# Patient Record
Sex: Male | Born: 1965 | Race: White | Hispanic: No | Marital: Single | Smoking: Never smoker
Health system: Southern US, Community
[De-identification: ages and names within clinical notes are randomized; demographics above are authoritative.]

## PROBLEM LIST (undated history)

## (undated) DIAGNOSIS — E669 Obesity, unspecified: Secondary | ICD-10-CM

## (undated) DIAGNOSIS — J9819 Other pulmonary collapse: Secondary | ICD-10-CM

## (undated) DIAGNOSIS — J40 Bronchitis, not specified as acute or chronic: Secondary | ICD-10-CM

## (undated) DIAGNOSIS — R600 Localized edema: Secondary | ICD-10-CM

## (undated) DIAGNOSIS — R269 Unspecified abnormalities of gait and mobility: Secondary | ICD-10-CM

## (undated) DIAGNOSIS — J45909 Unspecified asthma, uncomplicated: Secondary | ICD-10-CM

## (undated) DIAGNOSIS — R609 Edema, unspecified: Secondary | ICD-10-CM

## (undated) DIAGNOSIS — E559 Vitamin D deficiency, unspecified: Secondary | ICD-10-CM

## (undated) DIAGNOSIS — G43909 Migraine, unspecified, not intractable, without status migrainosus: Secondary | ICD-10-CM

## (undated) DIAGNOSIS — M4712 Other spondylosis with myelopathy, cervical region: Secondary | ICD-10-CM

## (undated) DIAGNOSIS — G809 Cerebral palsy, unspecified: Secondary | ICD-10-CM

## (undated) DIAGNOSIS — J189 Pneumonia, unspecified organism: Secondary | ICD-10-CM

## (undated) HISTORY — DX: Unspecified asthma, uncomplicated: J45.909

## (undated) HISTORY — DX: Obesity, unspecified: E66.9

## (undated) HISTORY — DX: Other spondylosis with myelopathy, cervical region: M47.12

## (undated) HISTORY — PX: INTERTROCHANTERIC HIP FRACTURE SURGERY: SHX681

## (undated) HISTORY — DX: Cerebral palsy, unspecified: G80.9

## (undated) HISTORY — DX: Edema, unspecified: R60.9

## (undated) HISTORY — PX: TENDON TRANSFER: SHX6109

## (undated) HISTORY — DX: Localized edema: R60.0

## (undated) HISTORY — PX: CERVICAL FUSION: SHX112

## (undated) HISTORY — PX: LAMINECTOMY LUMBAR SPLINE W/ PLACEMENT SPINAL CORD STIMULATOR: SHX1916

## (undated) HISTORY — PX: CHOLECYSTECTOMY: SHX55

## (undated) HISTORY — DX: Vitamin D deficiency, unspecified: E55.9

## (undated) HISTORY — DX: Unspecified abnormalities of gait and mobility: R26.9

## (undated) HISTORY — DX: Migraine, unspecified, not intractable, without status migrainosus: G43.909

---

## 2002-02-16 HISTORY — PX: LUNG SURGERY: SHX703

## 2002-04-11 ENCOUNTER — Encounter: Admission: RE | Admit: 2002-04-11 | Discharge: 2002-04-11 | Payer: Self-pay | Admitting: Family Medicine

## 2002-05-02 ENCOUNTER — Encounter: Admission: RE | Admit: 2002-05-02 | Discharge: 2002-07-07 | Payer: Self-pay | Admitting: Family Medicine

## 2002-11-07 ENCOUNTER — Encounter: Admission: RE | Admit: 2002-11-07 | Discharge: 2002-11-07 | Payer: Self-pay | Admitting: Family Medicine

## 2003-02-17 DIAGNOSIS — J9819 Other pulmonary collapse: Secondary | ICD-10-CM

## 2003-02-17 HISTORY — DX: Other pulmonary collapse: J98.19

## 2003-03-05 ENCOUNTER — Encounter: Admission: RE | Admit: 2003-03-05 | Discharge: 2003-03-05 | Payer: Self-pay | Admitting: Family Medicine

## 2003-03-12 ENCOUNTER — Encounter: Admission: RE | Admit: 2003-03-12 | Discharge: 2003-03-12 | Payer: Self-pay | Admitting: Family Medicine

## 2003-03-20 ENCOUNTER — Encounter: Admission: RE | Admit: 2003-03-20 | Discharge: 2003-03-20 | Payer: Self-pay | Admitting: Family Medicine

## 2003-04-16 ENCOUNTER — Encounter: Admission: RE | Admit: 2003-04-16 | Discharge: 2003-04-16 | Payer: Self-pay | Admitting: Family Medicine

## 2003-05-03 ENCOUNTER — Inpatient Hospital Stay (HOSPITAL_COMMUNITY): Admission: EM | Admit: 2003-05-03 | Discharge: 2003-05-15 | Payer: Self-pay | Admitting: Emergency Medicine

## 2003-05-03 ENCOUNTER — Encounter (INDEPENDENT_AMBULATORY_CARE_PROVIDER_SITE_OTHER): Payer: Self-pay | Admitting: *Deleted

## 2003-05-03 ENCOUNTER — Encounter: Admission: RE | Admit: 2003-05-03 | Discharge: 2003-05-03 | Payer: Self-pay | Admitting: Sports Medicine

## 2003-05-08 ENCOUNTER — Encounter (INDEPENDENT_AMBULATORY_CARE_PROVIDER_SITE_OTHER): Payer: Self-pay | Admitting: *Deleted

## 2003-05-23 ENCOUNTER — Encounter: Admission: RE | Admit: 2003-05-23 | Discharge: 2003-05-23 | Payer: Self-pay | Admitting: Thoracic Surgery

## 2003-05-29 ENCOUNTER — Encounter: Admission: RE | Admit: 2003-05-29 | Discharge: 2003-05-29 | Payer: Self-pay | Admitting: Sports Medicine

## 2003-06-08 ENCOUNTER — Encounter: Admission: RE | Admit: 2003-06-08 | Discharge: 2003-06-08 | Payer: Self-pay | Admitting: Family Medicine

## 2003-06-20 ENCOUNTER — Encounter: Admission: RE | Admit: 2003-06-20 | Discharge: 2003-06-20 | Payer: Self-pay | Admitting: Thoracic Surgery

## 2003-08-10 ENCOUNTER — Inpatient Hospital Stay (HOSPITAL_COMMUNITY): Admission: EM | Admit: 2003-08-10 | Discharge: 2003-08-15 | Payer: Self-pay | Admitting: Emergency Medicine

## 2003-08-13 ENCOUNTER — Encounter (INDEPENDENT_AMBULATORY_CARE_PROVIDER_SITE_OTHER): Payer: Self-pay | Admitting: *Deleted

## 2003-08-22 ENCOUNTER — Encounter: Admission: RE | Admit: 2003-08-22 | Discharge: 2003-08-22 | Payer: Self-pay | Admitting: Thoracic Surgery

## 2003-10-18 ENCOUNTER — Inpatient Hospital Stay (HOSPITAL_COMMUNITY): Admission: EM | Admit: 2003-10-18 | Discharge: 2003-10-24 | Payer: Self-pay | Admitting: Emergency Medicine

## 2003-12-20 ENCOUNTER — Encounter: Admission: RE | Admit: 2003-12-20 | Discharge: 2003-12-20 | Payer: Self-pay | Admitting: Thoracic Surgery

## 2003-12-26 ENCOUNTER — Ambulatory Visit: Payer: Self-pay | Admitting: Family Medicine

## 2003-12-28 ENCOUNTER — Ambulatory Visit: Payer: Self-pay | Admitting: Family Medicine

## 2004-04-09 ENCOUNTER — Ambulatory Visit: Payer: Self-pay | Admitting: Family Medicine

## 2004-11-20 ENCOUNTER — Ambulatory Visit: Payer: Self-pay | Admitting: Family Medicine

## 2004-11-21 ENCOUNTER — Encounter: Admission: RE | Admit: 2004-11-21 | Discharge: 2004-11-21 | Payer: Self-pay | Admitting: Sports Medicine

## 2004-12-08 ENCOUNTER — Encounter: Admission: RE | Admit: 2004-12-08 | Discharge: 2005-03-08 | Payer: Self-pay | Admitting: Sports Medicine

## 2005-04-18 IMAGING — CR DG CHEST 1V PORT
1 series · 1 of 1 positions shown · non-contrast
Comparison: none

CLINICAL DATA: Shortness of breath, left chest tube placement.
 PORTABLE CHEST, 05/03/03, [DATE] HOURS
 Comparison 04/16/03.
 Interval left lower chest tube with its tip at the medial aspect of the left lower lung zone.  Moderate sized left pleural effusion with improvement.  Left basilar air-space opacity.   Mildly increased prominence of the pulmonary vasculature and interstitial markings on the right.  Enlarged cardiac silhouette.  Unremarkable bones.  Patient?s chin overlying the medial left lung apex with no pneumothorax seen.  
 IMPRESSION 
 Status post left chest tube placement with an interval decrease in amount of left pleural fluid.
 Left basilar atelectasis or pneumonia.
 Cardiomegaly and mild congestive heart failure.

[view not recorded]
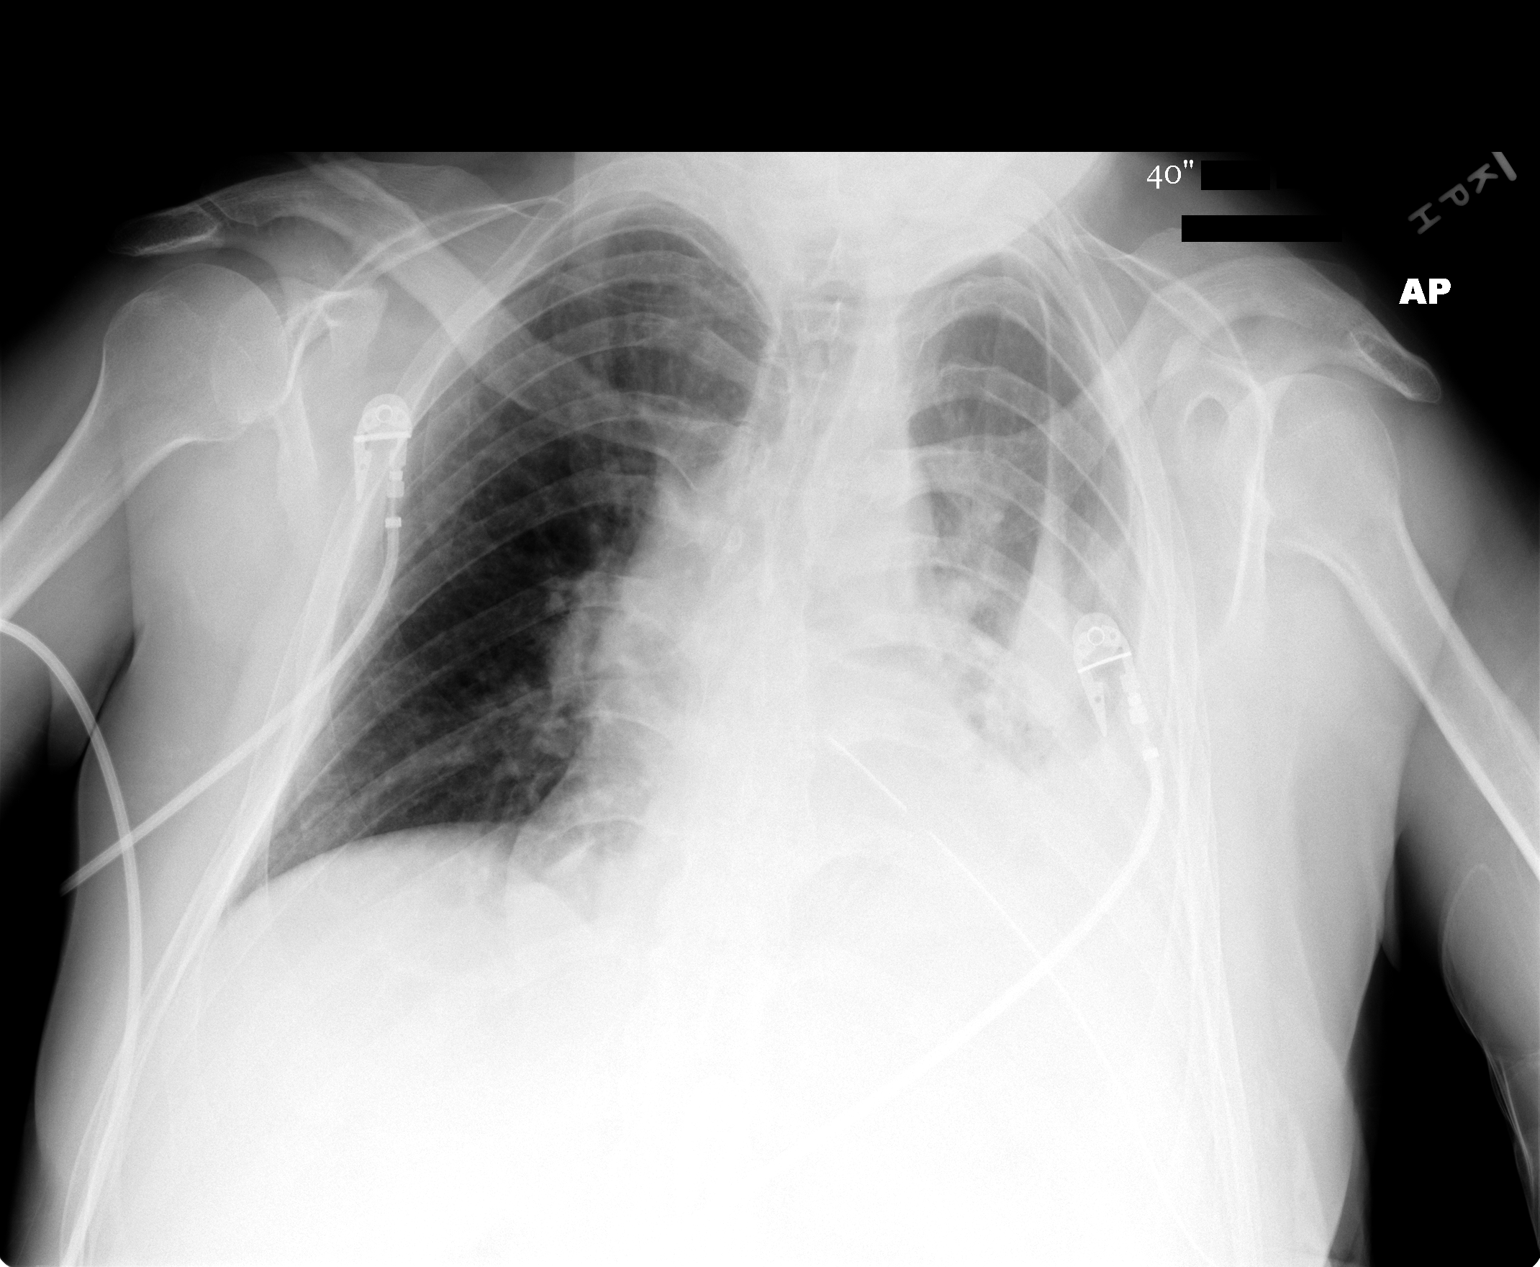

[1 of 1 positions shown; findings below may reference images not displayed]

## 2005-04-20 IMAGING — CR DG CHEST 1V PORT
2 series · 2 of 2 positions shown · non-contrast
Comparison: none

CLINICAL DATA: 37-year-old male with pleural effusion.  Chest tube insertion. 
 SINGLE PORTABLE CHEST 05/05/03 AT 9893 HOURS.  
 Comparison 05/04/03.

[view not recorded (1 of 2)]
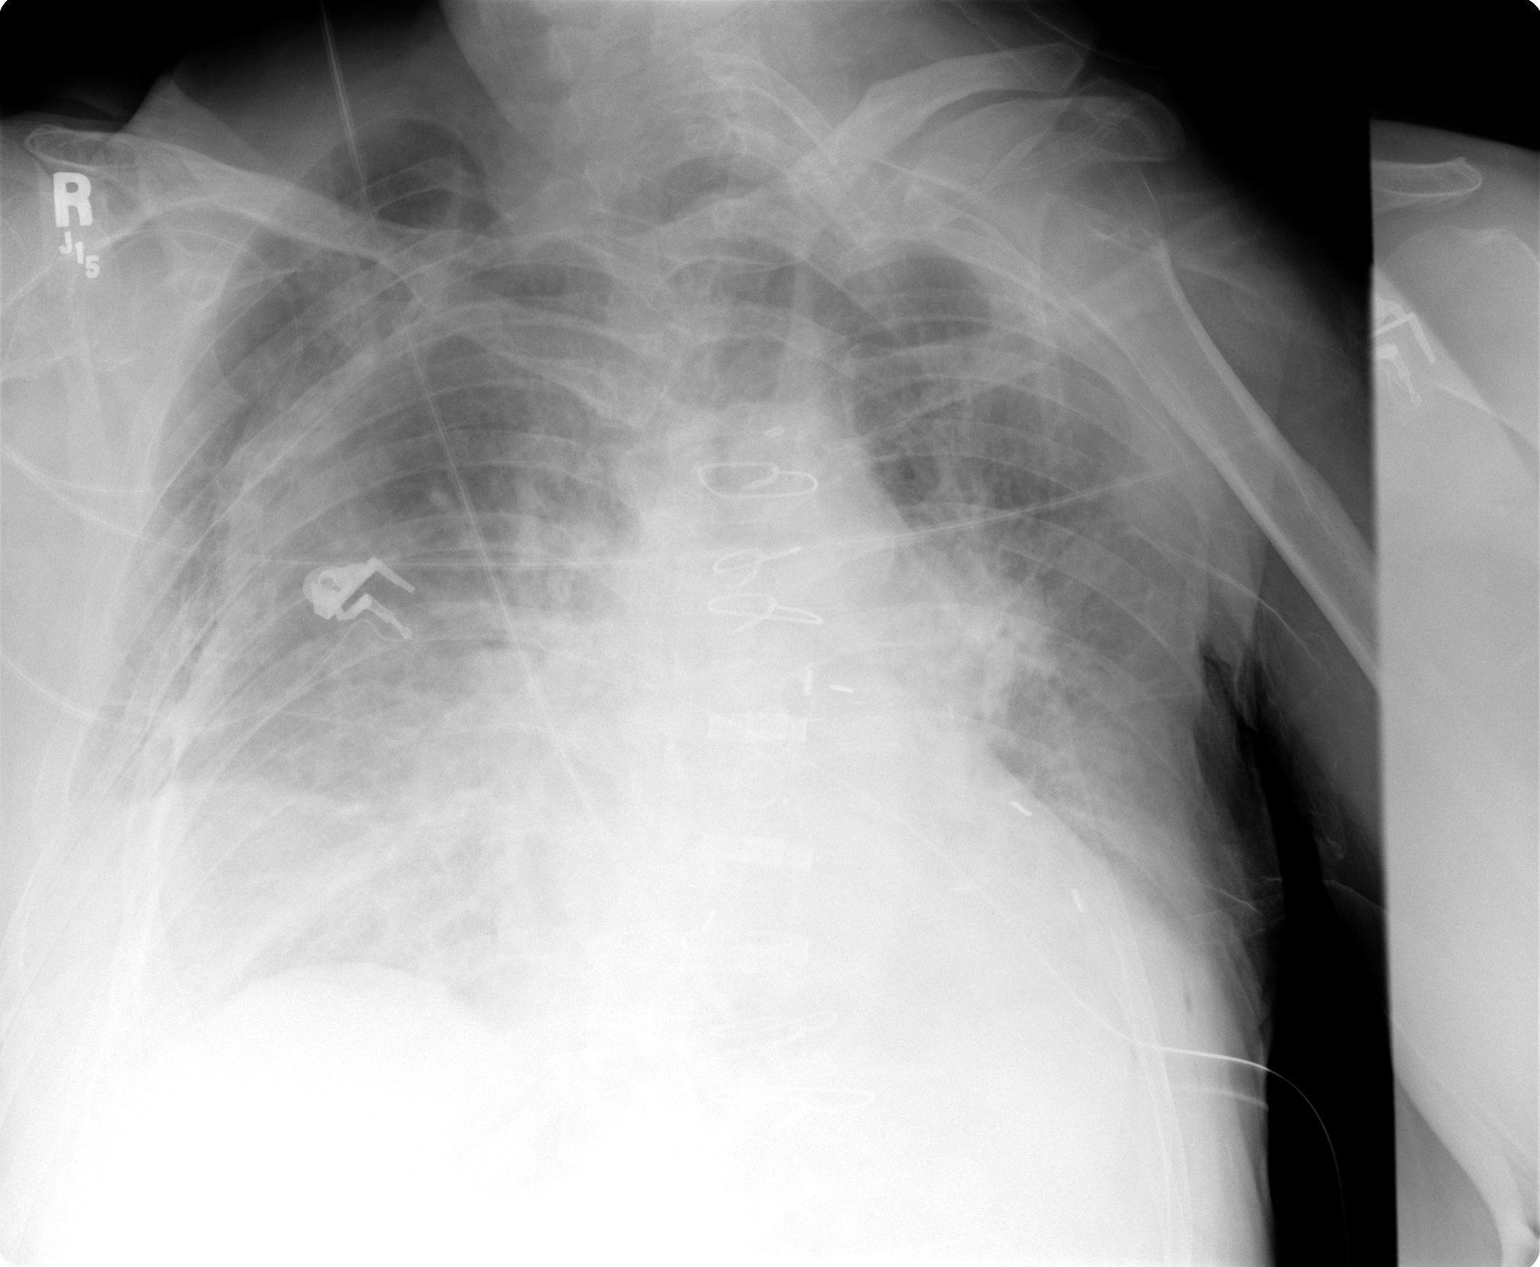

[view not recorded (2 of 2)]
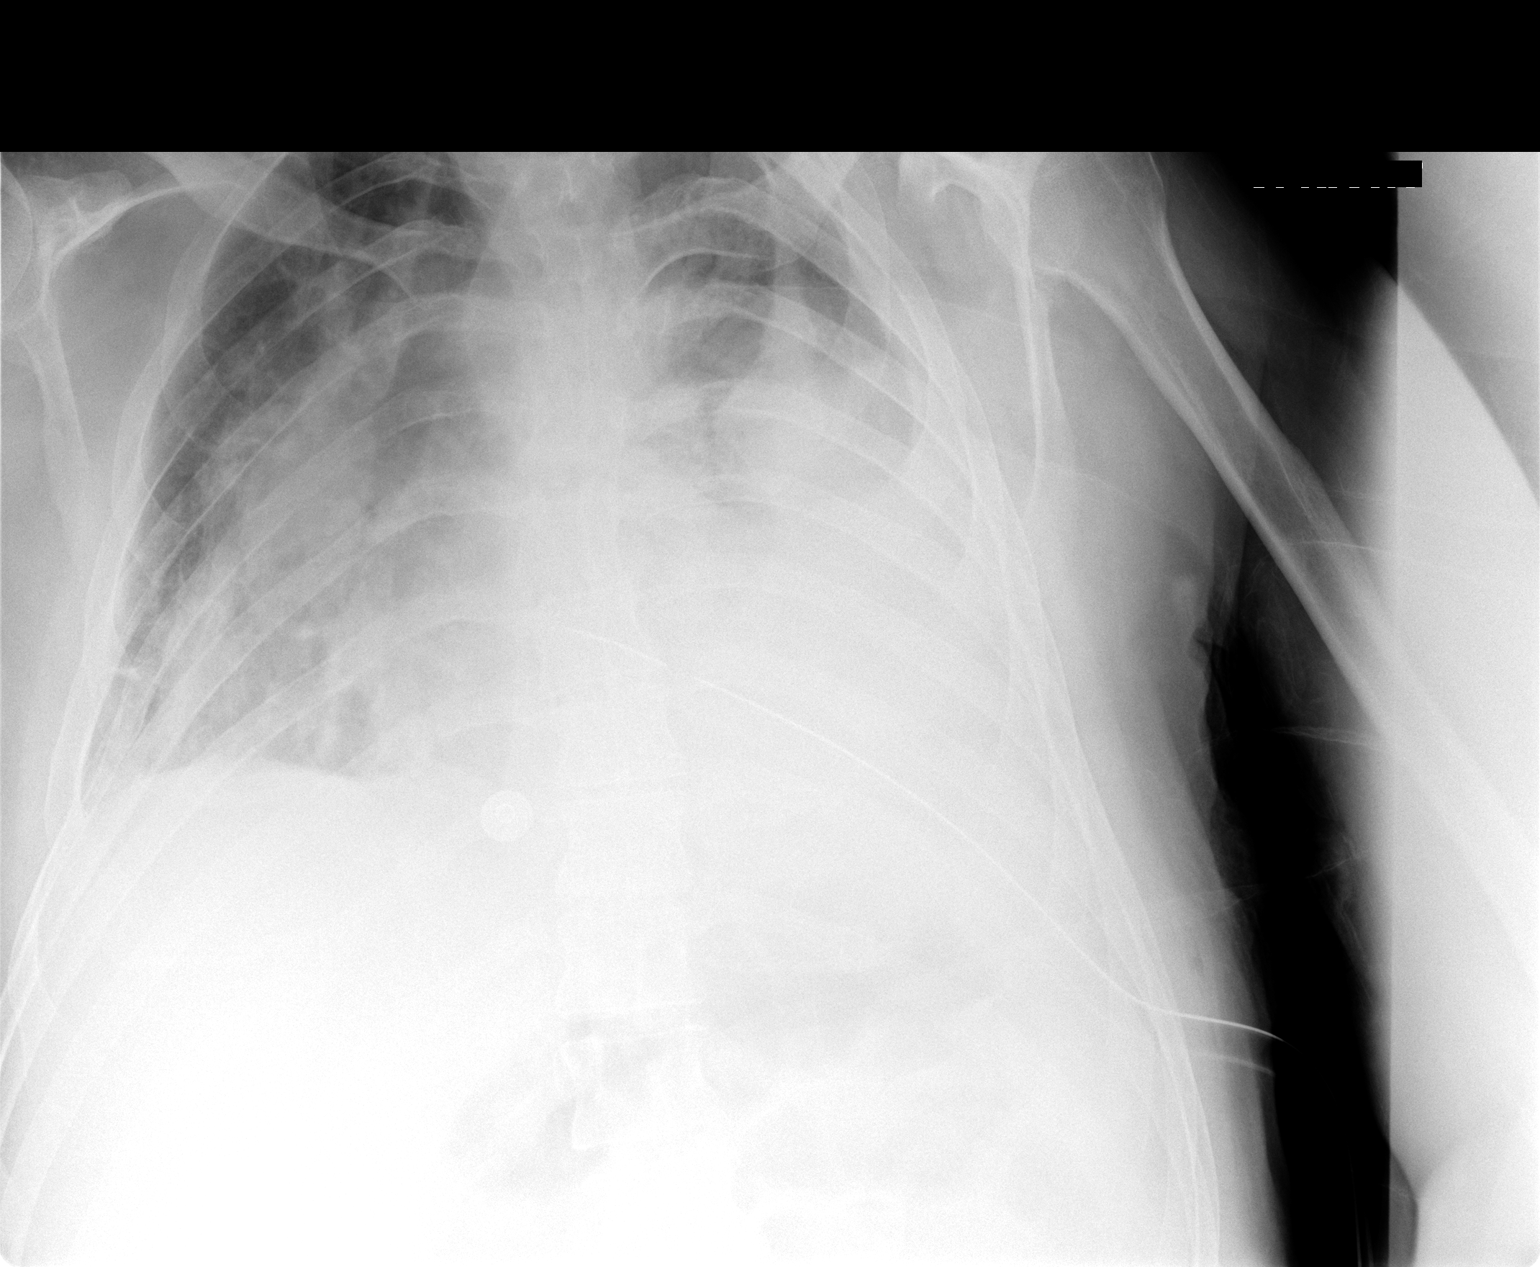

[2 of 2 positions shown; findings below may reference images not displayed]

FINDINGS: Left basilar chest tube remains extending to just across the mid line.  There is slight continued increase in the left pleural effusion with adjacent lingula and left lower lobe airspace disease/atelectasis.  The exam is rotated to the right.  Right perihilar and lower lobe airspace disease persists.  No pneumothorax.  
 IMPRESSION
 Increasing left pleural effusion.  
 Stable left lower lobe/lingula atelectasis/pneumonia.  
 Slight increasing right perihilar and lower lobe airspace disease/edema.  
 Of note, the initial chest radiograph on this patient was a double exposure and the film was repeated because of this.

## 2005-05-04 ENCOUNTER — Ambulatory Visit: Payer: Self-pay | Admitting: Family Medicine

## 2005-09-28 ENCOUNTER — Ambulatory Visit: Payer: Self-pay | Admitting: Family Medicine

## 2005-12-21 ENCOUNTER — Ambulatory Visit: Payer: Self-pay | Admitting: Family Medicine

## 2006-02-10 ENCOUNTER — Ambulatory Visit: Payer: Self-pay | Admitting: Family Medicine

## 2006-03-11 ENCOUNTER — Encounter (INDEPENDENT_AMBULATORY_CARE_PROVIDER_SITE_OTHER): Payer: Self-pay | Admitting: Family Medicine

## 2006-03-11 ENCOUNTER — Ambulatory Visit (HOSPITAL_COMMUNITY): Admission: RE | Admit: 2006-03-11 | Discharge: 2006-03-11 | Payer: Self-pay | Admitting: Family Medicine

## 2006-03-11 ENCOUNTER — Ambulatory Visit: Payer: Self-pay | Admitting: Family Medicine

## 2006-03-11 LAB — CONVERTED CEMR LAB
AST: 14 units/L (ref 0–37)
Alkaline Phosphatase: 111 units/L (ref 39–117)
CO2: 24 meq/L (ref 19–32)
Chloride: 102 meq/L (ref 96–112)
Glucose, Bld: 76 mg/dL (ref 70–99)
HCT: 45.7 % (ref 39.0–52.0)
Hemoglobin: 16.2 g/dL (ref 13.0–17.0)
MCHC: 35.4 g/dL (ref 30.0–36.0)
MCV: 84.8 fL (ref 78.0–100.0)
RBC: 5.39 M/uL (ref 4.22–5.81)
RDW: 13 % (ref 11.5–14.0)
Total Bilirubin: 1.7 mg/dL — ABNORMAL HIGH (ref 0.3–1.2)
Total Protein: 7.2 g/dL (ref 6.0–8.3)
WBC: 10.8 10*3/uL — ABNORMAL HIGH (ref 4.0–10.5)

## 2006-03-19 ENCOUNTER — Ambulatory Visit: Payer: Self-pay | Admitting: Family Medicine

## 2006-03-19 ENCOUNTER — Encounter (INDEPENDENT_AMBULATORY_CARE_PROVIDER_SITE_OTHER): Payer: Self-pay | Admitting: Family Medicine

## 2006-03-19 LAB — CONVERTED CEMR LAB
BUN: 16 mg/dL (ref 6–23)
Chloride: 101 meq/L (ref 96–112)
HDL: 50 mg/dL (ref >39)
LDL Cholesterol: 112 mg/dL — ABNORMAL HIGH (ref 0–99)
Total CHOL/HDL Ratio: 3.7
Triglycerides: 117 mg/dL (ref <150)
VLDL: 23 mg/dL (ref 0–40)

## 2006-04-15 DIAGNOSIS — G809 Cerebral palsy, unspecified: Secondary | ICD-10-CM | POA: Insufficient documentation

## 2006-04-15 DIAGNOSIS — I10 Essential (primary) hypertension: Secondary | ICD-10-CM

## 2006-04-27 ENCOUNTER — Encounter: Admission: RE | Admit: 2006-04-27 | Discharge: 2006-07-26 | Payer: Self-pay | Admitting: Family Medicine

## 2006-09-06 ENCOUNTER — Telehealth: Payer: Self-pay | Admitting: *Deleted

## 2006-09-08 ENCOUNTER — Ambulatory Visit: Payer: Self-pay | Admitting: Family Medicine

## 2006-09-08 LAB — CONVERTED CEMR LAB
Blood in Urine, dipstick: NEGATIVE
Glucose, Urine, Semiquant: NEGATIVE
Nitrite: NEGATIVE
Specific Gravity, Urine: 1.015
Urobilinogen, UA: 0.2

## 2006-11-30 ENCOUNTER — Telehealth: Payer: Self-pay | Admitting: *Deleted

## 2006-12-03 ENCOUNTER — Ambulatory Visit: Payer: Self-pay | Admitting: Internal Medicine

## 2006-12-03 DIAGNOSIS — L299 Pruritus, unspecified: Secondary | ICD-10-CM | POA: Insufficient documentation

## 2007-02-01 ENCOUNTER — Ambulatory Visit: Payer: Self-pay | Admitting: Sports Medicine

## 2007-02-01 LAB — CONVERTED CEMR LAB: Blood Glucose, Fingerstick: 97

## 2007-02-07 ENCOUNTER — Telehealth: Payer: Self-pay | Admitting: *Deleted

## 2007-02-08 ENCOUNTER — Encounter (INDEPENDENT_AMBULATORY_CARE_PROVIDER_SITE_OTHER): Payer: Self-pay | Admitting: Family Medicine

## 2007-02-08 ENCOUNTER — Ambulatory Visit: Payer: Self-pay | Admitting: Vascular Surgery

## 2007-02-08 ENCOUNTER — Ambulatory Visit (HOSPITAL_COMMUNITY): Admission: RE | Admit: 2007-02-08 | Discharge: 2007-02-08 | Payer: Self-pay | Admitting: Cardiology

## 2007-02-08 ENCOUNTER — Ambulatory Visit: Payer: Self-pay | Admitting: Sports Medicine

## 2007-02-08 ENCOUNTER — Encounter (INDEPENDENT_AMBULATORY_CARE_PROVIDER_SITE_OTHER): Payer: Self-pay | Admitting: Cardiology

## 2007-02-08 DIAGNOSIS — R635 Abnormal weight gain: Secondary | ICD-10-CM

## 2007-02-08 LAB — CONVERTED CEMR LAB
Chloride: 100 meq/L (ref 96–112)
Creatinine, Ser: 0.76 mg/dL (ref 0.40–1.50)
Potassium: 3.5 meq/L (ref 3.5–5.3)

## 2007-02-13 ENCOUNTER — Encounter (INDEPENDENT_AMBULATORY_CARE_PROVIDER_SITE_OTHER): Payer: Self-pay | Admitting: Family Medicine

## 2007-05-30 ENCOUNTER — Telehealth (INDEPENDENT_AMBULATORY_CARE_PROVIDER_SITE_OTHER): Payer: Self-pay | Admitting: Family Medicine

## 2007-06-07 ENCOUNTER — Telehealth (INDEPENDENT_AMBULATORY_CARE_PROVIDER_SITE_OTHER): Payer: Self-pay | Admitting: Family Medicine

## 2007-06-09 ENCOUNTER — Encounter (INDEPENDENT_AMBULATORY_CARE_PROVIDER_SITE_OTHER): Payer: Self-pay | Admitting: Family Medicine

## 2007-11-18 ENCOUNTER — Ambulatory Visit: Payer: Self-pay | Admitting: Family Medicine

## 2008-01-16 ENCOUNTER — Encounter: Payer: Self-pay | Admitting: *Deleted

## 2008-01-27 ENCOUNTER — Encounter (INDEPENDENT_AMBULATORY_CARE_PROVIDER_SITE_OTHER): Payer: Self-pay | Admitting: Family Medicine

## 2008-02-01 ENCOUNTER — Ambulatory Visit: Payer: Self-pay | Admitting: Family Medicine

## 2008-02-01 ENCOUNTER — Encounter (INDEPENDENT_AMBULATORY_CARE_PROVIDER_SITE_OTHER): Payer: Self-pay | Admitting: Family Medicine

## 2008-02-03 ENCOUNTER — Telehealth (INDEPENDENT_AMBULATORY_CARE_PROVIDER_SITE_OTHER): Payer: Self-pay | Admitting: Family Medicine

## 2008-02-03 DIAGNOSIS — E559 Vitamin D deficiency, unspecified: Secondary | ICD-10-CM | POA: Insufficient documentation

## 2008-02-03 LAB — CONVERTED CEMR LAB
AST: 27 units/L (ref 0–37)
Albumin: 5.1 g/dL (ref 3.5–5.2)
CO2: 20 meq/L (ref 19–32)
Calcium: 9.8 mg/dL (ref 8.4–10.5)
Creatinine, Ser: 0.78 mg/dL (ref 0.40–1.50)
Direct LDL: 141 mg/dL — ABNORMAL HIGH
Glucose, Bld: 85 mg/dL (ref 70–99)
Sodium: 141 meq/L (ref 135–145)

## 2008-03-14 ENCOUNTER — Ambulatory Visit: Payer: Self-pay | Admitting: Family Medicine

## 2008-05-01 ENCOUNTER — Telehealth: Payer: Self-pay | Admitting: *Deleted

## 2008-05-08 ENCOUNTER — Encounter (INDEPENDENT_AMBULATORY_CARE_PROVIDER_SITE_OTHER): Payer: Self-pay | Admitting: Family Medicine

## 2008-05-08 ENCOUNTER — Ambulatory Visit: Payer: Self-pay | Admitting: Family Medicine

## 2008-05-08 LAB — CONVERTED CEMR LAB
Cholesterol: 175 mg/dL (ref 0–200)
HDL: 47 mg/dL (ref >39)
VLDL: 20 mg/dL (ref 0–40)
Vit D, 25-Hydroxy: 23 ng/mL — ABNORMAL LOW (ref 30–89)

## 2008-05-09 ENCOUNTER — Telehealth (INDEPENDENT_AMBULATORY_CARE_PROVIDER_SITE_OTHER): Payer: Self-pay | Admitting: Family Medicine

## 2008-05-14 ENCOUNTER — Telehealth (INDEPENDENT_AMBULATORY_CARE_PROVIDER_SITE_OTHER): Payer: Self-pay | Admitting: Family Medicine

## 2008-05-21 ENCOUNTER — Telehealth: Payer: Self-pay | Admitting: *Deleted

## 2008-06-07 ENCOUNTER — Ambulatory Visit: Payer: Self-pay | Admitting: Family Medicine

## 2008-06-07 DIAGNOSIS — L219 Seborrheic dermatitis, unspecified: Secondary | ICD-10-CM | POA: Insufficient documentation

## 2008-08-07 ENCOUNTER — Telehealth: Payer: Self-pay | Admitting: Family Medicine

## 2008-08-08 ENCOUNTER — Ambulatory Visit: Payer: Self-pay | Admitting: Family Medicine

## 2008-08-08 DIAGNOSIS — R51 Headache: Secondary | ICD-10-CM

## 2008-08-08 DIAGNOSIS — R519 Headache, unspecified: Secondary | ICD-10-CM | POA: Insufficient documentation

## 2008-11-19 ENCOUNTER — Ambulatory Visit: Payer: Self-pay | Admitting: Family Medicine

## 2009-04-24 ENCOUNTER — Encounter: Payer: Self-pay | Admitting: Family Medicine

## 2009-04-25 ENCOUNTER — Telehealth: Payer: Self-pay | Admitting: *Deleted

## 2009-04-25 ENCOUNTER — Encounter: Admission: RE | Admit: 2009-04-25 | Discharge: 2009-04-25 | Payer: Self-pay | Admitting: Family Medicine

## 2009-04-25 ENCOUNTER — Ambulatory Visit: Payer: Self-pay | Admitting: Family Medicine

## 2009-04-26 ENCOUNTER — Telehealth: Payer: Self-pay | Admitting: Family Medicine

## 2009-04-27 ENCOUNTER — Encounter: Payer: Self-pay | Admitting: Family Medicine

## 2009-04-27 ENCOUNTER — Ambulatory Visit: Payer: Self-pay | Admitting: Family Medicine

## 2009-04-27 ENCOUNTER — Inpatient Hospital Stay (HOSPITAL_COMMUNITY): Admission: EM | Admit: 2009-04-27 | Discharge: 2009-04-30 | Payer: Self-pay | Admitting: Emergency Medicine

## 2009-05-01 ENCOUNTER — Telehealth: Payer: Self-pay | Admitting: Family Medicine

## 2009-05-09 ENCOUNTER — Ambulatory Visit: Payer: Self-pay | Admitting: Family Medicine

## 2009-06-10 ENCOUNTER — Telehealth: Payer: Self-pay | Admitting: Family Medicine

## 2009-06-18 ENCOUNTER — Encounter: Payer: Self-pay | Admitting: Family Medicine

## 2009-07-25 ENCOUNTER — Telehealth: Payer: Self-pay | Admitting: Family Medicine

## 2009-07-30 ENCOUNTER — Encounter: Payer: Self-pay | Admitting: Family Medicine

## 2009-10-04 ENCOUNTER — Ambulatory Visit: Payer: Self-pay | Admitting: Family Medicine

## 2009-11-28 ENCOUNTER — Ambulatory Visit: Payer: Self-pay | Admitting: Family Medicine

## 2009-11-28 ENCOUNTER — Encounter: Payer: Self-pay | Admitting: Family Medicine

## 2009-11-28 LAB — CONVERTED CEMR LAB
ALT: 24 units/L (ref 0–53)
AST: 27 units/L (ref 0–37)
Albumin: 4.7 g/dL (ref 3.5–5.2)
Calcium: 9.6 mg/dL (ref 8.4–10.5)
Cholesterol: 195 mg/dL (ref 0–200)
Glucose, Bld: 80 mg/dL (ref 70–99)
HDL: 52 mg/dL (ref >39)
Sodium: 139 meq/L (ref 135–145)
Total Bilirubin: 3.2 mg/dL — ABNORMAL HIGH (ref 0.3–1.2)
Total CHOL/HDL Ratio: 3.8
Triglycerides: 81 mg/dL (ref <150)

## 2010-03-04 ENCOUNTER — Encounter: Payer: Self-pay | Admitting: *Deleted

## 2010-03-18 NOTE — Progress Notes (Signed)
Summary: MEDS PROB  Phone Note Call from Patient   Summary of Call: [facility] cannot get this meds(Levaquin) and needs to refer to [facility] Initial call taken by: De Nurse,  April 25, 2009 3:53 PM  Follow-up for Phone Call        lm for him to call me back. Follow-up by: Golden Circle RN,  April 25, 2009 3:54 PM  Additional Follow-up for Phone Call Additional follow up Details #1::        pt informed Additional Follow-up by: Golden Circle RN,  April 25, 2009 3:58 PM    Prescriptions: LEVAQUIN 750-5 MG/150ML-% SOLN (LEVOFLOXACIN IN D5W) take 750 mg once daily for 5 days please take 1 hour before meals or 2 hours after meals to ensure effectivenss  #5 days (qs) x 0   Entered by:   Golden Circle RN   Authorized by:   Doree Albee MD   Signed by:   Golden Circle RN on 04/25/2009   Method used:   Electronically to        [facility]* (retail)       [address]       [place], [place]  [id number]       Ph: [phone]       Fax: [phone]   RxID:   [id number]

## 2010-03-18 NOTE — Assessment & Plan Note (Signed)
Summary: migraines,tcb   Vital Signs:  Patient profile:   45 year old male Temp:     98.9 degrees F Pulse rate:   79 / minute BP sitting:   120 / 98  Vitals Entered By: Jone Baseman CMA (October 04, 2009 9:37 AM) CC: migraines x 1 week Is Patient Diabetic? No Pain Assessment Patient in pain? yes     Location: head Intensity: 6   Primary Care Provider:  Bobby Rumpf  MD  CC:  migraines x 1 week.  History of Present Illness: 1) Migraines: Symptoms x 1 week. Partial relief with Excedrin migraine. Lasts for > 4 hours. Right sided. Pounding, hurts behind eye. Triggered by cigarette smoke. +nausea, emesis (non-bloody, non bilious) x 1Last episode of similar headache 5 months ago with pneumonia. No associated vision change or new focal neurological signs or aura. Headache does not wake patient from sleep. Better with rest. Worse with bright lights, fluorescent lights, loud noises. Mild sensation of sinus congestion, runny nose. Headache has been relieved in past with IM Toradol in clinic.    ROS: Denies new focal neuological signs, vision change, cough, fever, chills, diarrhea, abdominal pain, head trauma,   Habits & Providers  Alcohol-Tobacco-Diet     Tobacco Status: never  Current Medications (verified): 1)  Hydrochlorothiazide 12.5 Mg Tabs (Hydrochlorothiazide) .... Take 1 Tablet By Mouth Once A Day 2)  Vitamin D3 1000 Unit Tabs (Cholecalciferol) .... Take One Tablet Daily X 3 Months and Then Have Vit D Level Rechecked 3)  Calcium 500 Mg Tabs (Calcium Carbonate) .... Take One Tablet Two Times A Day 4)  Therapeutic T+plus Max St 3 % Sham (Salicylic Acid) .... Massage Into Wet Hair, Wait 5 Minutes; Rinse Thoroughly.  Use Daily X1 Week, Then Every Other Day.  Disp #1 Large Bottle. 5)  Triamcinolone Acetonide 0.025 % Lotn (Triamcinolone Acetonide) .... Apply To Scalp Two Times A Day X2 Weeks.  Disp #1 Large Bottle. 6)  Ventolin Hfa 108 (90 Base) Mcg/act Aers (Albuterol Sulfate)  .... One To Two Puffs Q 4 Hours As Needed Short of Breath. Dispense With Spacer and Instructions For Use.  Allergies (verified): No Known Drug Allergies  Physical Exam  General:  sitting in wheelchair, pleasant, NAD, appears unccomfortable with bright lights, much better when lights are turned off  Eyes:  pupils equal, round and reactive to light , extraoccular movements intact , fundi normal  Nose:  mild sinus tenderness over right frontal sinus  Mouth:  moist membranes  Neck:  limited ROM secondary to h/o spinal fusion    Impression & Recommendations:  Problem # 1:  HEADACHE (ICD-784.0) Assessment Deteriorated Migraine likely given symptomatology. Toradol 15 mg given IM - patient tolerated well. Headache diary given. Follow up symptoms. No red flags on history or exam. Follow up for CPE.  Orders: Ketorolac-Toradol 15mg  (Z6109) [facility]- Est Level  3 ([address]454)  Complete Medication List: 1)  Hydrochlorothiazide 12.5 Mg Tabs (Hydrochlorothiazide) .... Take 1 tablet by mouth once a day 2)  Vitamin D3 1000 Unit Tabs (Cholecalciferol) .... Take one tablet daily x 3 months and then have vit d level rechecked 3)  Calcium 500 Mg Tabs (Calcium carbonate) .... Take one tablet two times a day 4)  Therapeutic T+plus Max St 3 % Sham (Salicylic acid) .... Massage into wet hair, wait 5 minutes; rinse thoroughly.  use daily x1 week, then every other day.  disp #1 large bottle. 5)  Triamcinolone Acetonide 0.025 % Lotn (Triamcinolone acetonide) .... Apply to  scalp two times a day x2 weeks.  disp #1 large bottle. 6)  Ventolin Hfa 108 (90 Base) Mcg/act Aers (Albuterol sulfate) .... One to two puffs q 4 hours as needed short of breath. dispense with spacer and instructions for use.  Patient Instructions: 1)  1) Follow up if headache not improving, or if developing worse nausea, vision problems, weakness or other concerning symptoms.  2)  2) Fill out headache diary - an online version is available at  [email or web address] 3)  3) Follow up as needed.  4)  4) Try to avoid triggers of headache.    Medication Administration  Injection # 1:    Medication: Ketorolac-Toradol 15mg     Diagnosis: HEADACHE (ICD-784.0)    Route: IM    Site: LUOQ gluteus    Exp Date: 03/20/2011    Lot #: [id number]    Mfr: [email or web address]    Comments: 30mg  given    Patient tolerated injection without complications    Given by: Jone Baseman CMA (October 04, 2009 10:10 AM)  Orders Added: 1)  Ketorolac-Toradol 15mg  [J1885] 2)  [facility]- Est Level  3 [65784]

## 2010-03-18 NOTE — Miscellaneous (Signed)
Summary: flu?  Clinical Lists Changes sick x 2 days. diarrhea, poor appetite, no fever, is coughing. HA is the worst symptom. has been taking tylenol. has not tried ibuprofen. [facility] [address]. is not able to get him in today. told her md is in clinic & I wuill call with response.Golden Circle RN  April 24, 2009 3:04 PM   Patient can take ibuprofen as needed for headache in the short term (over next 5-7 days) can take imodium as needed for diarrhea. If symptoms worsen, should come in for appointment prior to further medications.  Bobby Rumpf  MD  April 24, 2009 3:15 PM   Appended Document: flu? gave her above advise. she will call back if she wants him seen tomorrow. asked that she cal early in day to get best choice of times.

## 2010-03-18 NOTE — Progress Notes (Signed)
Summary: phn msg  Phone Note Call from Patient Call back at [phone]   Caller: mom-Phyllis Summary of Call: [facility] needs another statement that patient is in a permanent wheelchair. mom has application, so pls call her when this is ready so she can send this in all together Initial call taken by: De Nurse,  July 25, 2009 2:39 PM  Follow-up for Phone Call        Letter typed and placed at front desk. .    Follow-up by: Bobby Rumpf  MD,  July 30, 2009 8:49 AM

## 2010-03-18 NOTE — Assessment & Plan Note (Signed)
Summary: physical and flu shot/bmc   Vital Signs:  Patient profile:   45 year old male Height:      57 inches Weight:      201.9 pounds BMI:     43.85 Temp:     98.2 degrees F oral Pulse rate:   79 / minute BP sitting:   126 / 92  (left arm) Cuff size:   regular  Vitals Entered By: Garen Grams LPN (November 28, 2009 2:38 PM) CC: cpe Is Patient Diabetic? No Pain Assessment Patient in pain? no        Primary Care Provider:  Bobby Rumpf  MD  CC:  cpe.  History of Present Illness: 1) HTN: BP 126/92 today. Taking HCTZ as below without side effects. Denies chest pain, dyspnea, LE edema. Does not monitor salt intake or follow DASH diet.   2) Cerebral palsy: Wheelchair bound. Appropriate resources in place.   3) Prevention: Due for flu vaccine, lipd panel with HTN. Up to date on pneumovax.   Habits & Providers  Alcohol-Tobacco-Diet     Alcohol drinks/day: 0     Tobacco Status: never  Current Medications (verified): 1)  Hydrochlorothiazide 12.5 Mg Tabs (Hydrochlorothiazide) .... Take 1 Tablet By Mouth Once A Day 2)  Vitamin D3 1000 Unit Tabs (Cholecalciferol) .... Take One Tablet Daily X 3 Months and Then Have Vit D Level Rechecked 3)  Calcium 500 Mg Tabs (Calcium Carbonate) .... Take One Tablet Two Times A Day 4)  Therapeutic T+plus Max St 3 % Sham (Salicylic Acid) .... Massage Into Wet Hair, Wait 5 Minutes; Rinse Thoroughly.  Use Daily X1 Week, Then Every Other Day.  Disp #1 Large Bottle. 5)  Triamcinolone Acetonide 0.025 % Lotn (Triamcinolone Acetonide) .... Apply To Scalp Two Times A Day X2 Weeks.  Disp #1 Large Bottle. 6)  Ventolin Hfa 108 (90 Base) Mcg/act Aers (Albuterol Sulfate) .... One To Two Puffs Q 4 Hours As Needed Short of Breath. Dispense With Spacer and Instructions For Use.  Allergies (verified): No Known Drug Allergies  Physical Exam  General:  sitting in wheelchair, pleasant, NAD,  Head:  normocephalic and atraumatic.   Eyes:  pupils equal, round and  reactive to light, fundi normal  Ears:  TMs clear bilaterally Nose:  External nasal examination shows no deformity or inflammation. Nasal mucosa are pink and moist without lesions or exudates. Mouth:  moist membranes  Neck:  limited ROM secondary to h/o spinal fusion  Lungs:  CTAB, normal WOB, no retractions, no wheeze or crackles  Heart:  RRR, no murmurs  Abdomen:  S/NT/ND, +BS Msk:  wheelchair bound  Pulses:  2+ radials  Extremities:  well perfused  Neurologic:  alert & oriented X3.  cranial nerves II-XII intact.   Skin:  no skin breakdown  Psych:  normally interactive, good eye contact, not anxious appearing, and not depressed appearing.     Impression & Recommendations:  Problem # 1:  Preventive Health Care (ICD-V70.0)  Flu vaccine today. Check lipid panel. Follow up one year.   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 No medication changes at this time. Follow up 6 months.   His updated medication list for this problem includes:    Hydrochlorothiazide 12.5 Mg Tabs (Hydrochlorothiazide) .Marland Kitchen... Take 1 tablet by mouth once a day  Orders: Lipid-[facility] ([id number]) Comp Met-[facility] ([id number])  BP today: 126/92 Prior BP: 120/98 (10/04/2009)  Labs Reviewed: K+: 3.7 (02/01/2008) Creat: :  0.78 (02/01/2008)   Chol: 175 (05/08/2008)   HDL: 47 (05/08/2008)   LDL: 108 (05/08/2008)   TG: 101 (05/08/2008)  Complete Medication List: 1)  Hydrochlorothiazide 12.5 Mg Tabs (Hydrochlorothiazide) .... Take 1 tablet by mouth once a day 2)  Vitamin D3 1000 Unit Tabs (Cholecalciferol) .... Take one tablet daily x 3 months and then have vit d level rechecked 3)  Calcium 500 Mg Tabs (Calcium carbonate) .... Take one tablet two times a day 4)  Therapeutic T+plus Max St 3 % Sham (Salicylic acid) .... Massage into wet hair, wait 5 minutes; rinse thoroughly.  use daily x1 week, then every other day.  disp #1 large bottle. 5)   Triamcinolone Acetonide 0.025 % Lotn (Triamcinolone acetonide) .... Apply to scalp two times a day x2 weeks.  disp #1 large bottle. 6)  Ventolin Hfa 108 (90 Base) Mcg/act Aers (Albuterol sulfate) .... One to two puffs q 4 hours as needed short of breath. dispense with spacer and instructions for use.  Other Orders: Flu Vaccine 50yrs + [phone]) Admin 1st Vaccine (86578) Admin 1st Vaccine [facility]) [phone])  Patient Instructions: 1)  It was great to see you today!  2)  Follow up in 6 months  - sooner if needed. Prescriptions: VENTOLIN HFA 108 (90 BASE) MCG/ACT AERS (ALBUTEROL SULFATE) one to two puffs q 4 hours as needed short of breath. Dispense with spacer and instructions for use.  #1 x 1   Entered and Authorized by:   Bobby Rumpf  MD   Signed by:   Bobby Rumpf  MD on 11/28/2009   Method used:   Electronically to        [facility]* (retail)       [address]       [place], [place]  [address]       Ph: [phone]       Fax: [phone]   RxID:   [id number]    Influenza Vaccine    Vaccine Type: Fluvax 3+    Site: left deltoid    Mfr: GlaxoSmithKline    Dose: 0.5 ml    Route: IM    Given by: Garen Grams LPN    Exp. Date: 08/10/2010    Lot #: [id number]    VIS given: 09/10/09 version given November 28, 2009.  Flu Vaccine Consent Questions    Do you have a history of severe allergic reactions to this vaccine? no    Any prior history of allergic reactions to egg and/or gelatin? no    Do you have a sensitivity to the preservative Thimersol? no    Do you have a past history of Guillan-Barre Syndrome? no    Do you currently have an acute febrile illness? no    Have you ever had a severe reaction to latex? no    Vaccine information given and explained to patient? yes

## 2010-03-18 NOTE — Progress Notes (Signed)
Summary: phn msg  Phone Note Call from Patient Call back at [phone]   Caller: Mom-Phyllis Summary of Call: [facility] is stating that they have a new rule stating that he needs a shoulder strap that comes from window-across his head & neck and he a plate in head and cannot turn his head.  It chokes him and can't wear it.  [facility] told Mom she needs a note from his doctor stating that he cannot wear this and needs to be excused from this new rule. Initial call taken by: De Nurse,  June 10, 2009 11:57 AM  Follow-up for Phone Call        mom is asking again about note for [facility] Follow-up by: De Nurse,  Jun 18, 2009 11:17 AM  Additional Follow-up for Phone Call Additional follow up Details #1::        to pcp. please write this note asap Additional Follow-up by: Golden Circle RN,  Jun 18, 2009 11:39 AM    Additional Follow-up for Phone Call Additional follow up Details #2::    Notified mother that letter ready to be picked up. Follow-up by: Clydell Hakim,  Jun 18, 2009 2:00 PM

## 2010-03-18 NOTE — Progress Notes (Signed)
Summary: triage  Phone Note Call from Patient Call back at Home Phone [phone]   Caller: mom-Phyliss Summary of Call: Seen yesterday had really bad night. Has double pneumonia and not sure if she should be surpressing the cough or letting him cough.   Initial call taken by: Clydell Hakim,  April 26, 2009 8:35 AM  Follow-up for Phone Call        has not eaten in 5 days. will take liquids. hx pna & collapsed lung. mom is Very concerned. he was up all night coughing & is very weak. told her I will speak with pcp & he or I will call her back Follow-up by: Golden Circle RN,  April 26, 2009 8:36 AM  Additional Follow-up for Phone Call Additional follow up Details #1::        Outpatient treatment for CAP w/ Levaquin. Felt warm, was coughing overnight. Able to get some rest today. Advised to continue to encourage by mouth intake especially fluids. If not improving, advised to call our triage line. Advised Tylenol or fever / pain control  Additional Follow-up by: Bobby Rumpf  MD,  April 26, 2009 12:38 PM

## 2010-03-18 NOTE — Letter (Signed)
Summary: Generic Letter  [facility]  [address]   [place], [place] [address]   Phone: [phone]  Fax: [phone]    07/30/2009  [name] [address] [place], [place]  [address]  To whom it may concern:  This letter is to certify that the above-mentioned patient is permanently confined to a wheelchair as a result of cerebral palsy. Please feel free to direct any questions or correspondence regarding this or any other health-related matters to my office at the address and/or phone number provided.       Sincerely,   Bobby Rumpf  MD

## 2010-03-18 NOTE — Assessment & Plan Note (Signed)
Summary: hospital f/u per mom/tcb   Vital Signs:  Patient profile:   45 year old male Height:      57 inches Weight:      199 pounds BMI:     43.22 Pulse rate:   86 / minute BP sitting:   124 / 88  Vitals Entered By: Golden Circle RN (May 09, 2009 8:36 AM)  Primary Care Provider:  Bobby Rumpf  MD  CC:  Follow up [facility] hosp.Marland Kitchen  History of Present Illness: 1) PNA: Hospitalized from 3/15 to 3/20 for treatment of CAP (had attempted outpatient treatment with Levaquin but patient was not improving though had been on medication less than 48 hrs). Received 4 days of ceftriaxone, 5 days of azithromycin. Reports breathing is much better, denies fevers, chills, emesis, diarrhea, chest pain, wheezing. Reports improvement in coughing; he is using his albuterol inhaler every four hours for his coughing. Takes Tylenol PM to help with sleep. Completed antibiotics course day after discharge.   Habits & Providers  Alcohol-Tobacco-Diet     Alcohol drinks/day: 0     Tobacco Status: never  Exercise-Depression-Behavior     Drug Use: never     Seat Belt Use: always  Current Medications (verified): 1)  Hydrochlorothiazide 12.5 Mg Tabs (Hydrochlorothiazide) .... Take 1 Tablet By Mouth Once A Day 2)  Vitamin D3 1000 Unit Tabs (Cholecalciferol) .... Take One Tablet Daily X 3 Months and Then Have Vit D Level Rechecked 3)  Calcium 500 Mg Tabs (Calcium Carbonate) .... Take One Tablet Two Times A Day 4)  Therapeutic T+plus Max St 3 % Sham (Salicylic Acid) .... Massage Into Wet Hair, Wait 5 Minutes; Rinse Thoroughly.  Use Daily X1 Week, Then Every Other Day.  Disp #1 Large Bottle. 5)  Triamcinolone Acetonide 0.025 % Lotn (Triamcinolone Acetonide) .... Apply To Scalp Two Times A Day X2 Weeks.  Disp #1 Large Bottle. 6)  Ventolin Hfa 108 (90 Base) Mcg/act Aers (Albuterol Sulfate) .... One To Two Puffs Q 4 Hours As Needed Short of Breath. Dispense With Spacer and Instructions For Use.  Allergies (verified): No  Known Drug Allergies  Social History: Risk analyst Use:  always Drug Use:  never  Physical Exam  General:  sitting in wheelchair, pleasant, NAD  Mouth:  moist membranes  Lungs:  CTAB, normal WOB, no retractions, no wheeze or crackles  Heart:  RRR, no murmurs  Abdomen:  S/NT/ND, +BS Pulses:  2+ radials  Extremities:  well perfused  Neurologic:  alert & oriented X3.     Impression & Recommendations:  Problem # 1:  PNEUMONIA, BILATERAL (ICD-486) Assessment Improved  Improving. Advised to use incentive spirometer hourly until improved, then three times a day. Refilled ventolin with spacer; provided instructions for use. Reviewed red flags that would prompt return to care. Consider repeat Xray at 6 weeks for follow up.   Orders: [facility]- Est Level  3 ([address])  Complete Medication List: 1)  Hydrochlorothiazide 12.5 Mg Tabs (Hydrochlorothiazide) .... Take 1 tablet by mouth once a day 2)  Vitamin D3 1000 Unit Tabs (Cholecalciferol) .... Take one tablet daily x 3 months and then have vit d level rechecked 3)  Calcium 500 Mg Tabs (Calcium carbonate) .... Take one tablet two times a day 4)  Therapeutic T+plus Max St 3 % Sham (Salicylic acid) .... Massage into wet hair, wait 5 minutes; rinse thoroughly.  use daily x1 week, then every other day.  disp #1 large bottle. 5)  Triamcinolone Acetonide 0.025 % Lotn (Triamcinolone  acetonide) .... Apply to scalp two times a day x2 weeks.  disp #1 large bottle. 6)  Ventolin Hfa 108 (90 Base) Mcg/act Aers (Albuterol sulfate) .... One to two puffs q 4 hours as needed short of breath. dispense with spacer and instructions for use.  Patient Instructions: 1)  Continue to use the incentive spirometer every hour to help keep lungs open. 2)  I have refilled your albuterol inhaler WITH a spacer. You can pick this up from the pharmacy.  3)  Call me if you have worse breathing, fevers, unable to keep foods down. etc.  4)  Follow up if not improving in 1-2 weeks.    Prescriptions: VENTOLIN HFA 108 (90 BASE) MCG/ACT AERS (ALBUTEROL SULFATE) one to two puffs q 4 hours as needed short of breath. Dispense with spacer and instructions for use.  #1 x 1   Entered and Authorized by:   Bobby Rumpf  MD   Signed by:   Bobby Rumpf  MD on 05/09/2009   Method used:   Electronically to        [facility] [address] (retail)       [phone]       [place], [place]  [address]       Ph: [phone]       Fax: [phone]   RxID:   [id number] VENTOLIN HFA 108 (90 BASE) MCG/ACT AERS (ALBUTEROL SULFATE) one to two puffs q 4 hours as needed short of breath. Dispense with spacer and instructions for use.  #1 x 1   Entered and Authorized by:   Bobby Rumpf  MD   Signed by:   Bobby Rumpf  MD on 05/09/2009   Method used:   Electronically to        [facility]* (retail)       [address]       [place], [place]  [address]       Ph: [phone]       Fax: [phone]   RxID:   [id number]

## 2010-03-18 NOTE — Progress Notes (Signed)
Summary: RX  Phone Note Call from Patient Call back at Home Phone [phone] Call back at [phone]   Reason for Call: Talk to Doctor Summary of Call: mom requesting to speak with MD re: pts Rxs, pt just released from hospital Initial call taken by: Knox Royalty,  May 01, 2009 9:35 AM  Follow-up for Phone Call        rec'd prior auth request from [facility] on [address] for his levaquin. spoke with Dr. Earnest Bailey who wants to do a prior auth for this rather than change him. states she will be here shortly to complete it. 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  Please let me know if this is rejected. Additional Follow-up by: Delbert Harness MD,  May 01, 2009 4:46 PM

## 2010-03-18 NOTE — Assessment & Plan Note (Signed)
Summary: hospital admission for pneumonia    Impression & Recommendations:  Problem # 1:  PNEUMONIA, BILATERAL (ICD-486) Assessment Deteriorated Community Acquired.  Worsening clinical status.  admit to inpatient.  Change antibiotics to Ceftriaxone and azithro.  (It is likely that the levaquin was adequate; he had been taking it less than 48 hours prior to arrival.)  Gram stain and culture of sputum.  Check urine legionella and urine strep antigens.  hycodan for cough.  Also, is wheezing on exam.  Will order albuterol nebs as needed as well.    His updated medication list for this problem includes:    Levaquin 750 Mg Tabs (Levofloxacin) .Marland Kitchen... Take 1 tab daily for 7 days  Problem # 2:  HYPERTENSION, BENIGN SYSTEMIC (ICD-401.1) Assessment: Unchanged He tells [place] today that he does not take his hctz regularly.  will leave him off and monitor bps.   His updated medication list for this problem includes:    Hydrochlorothiazide 12.5 Mg Tabs (Hydrochlorothiazide) .Marland Kitchen... Take 1 tablet by mouth once a day  Problem # 3:  CEREBRAL PALSY (ICD-343.9) Assessment: Unchanged stable.  his body habitus probably gives him a restrictive lung picture to a certain extent.    Problem # 4:  HYPOKALEMIA (ICD-276.8) Assessment: New replete and put K+ in his fluids  Problem # 5:  fengi 1 liter NS bolus; then IVF at 150cc/hour.  He is a little hyponatremic at 133; likely from mild volume depletion.  he would prefer a liquid diet for now, but will advance as tolerated  Problem # 6:  prophylaxis heparin  Complete Medication List: 1)  Hydrochlorothiazide 12.5 Mg Tabs (Hydrochlorothiazide) .... Take 1 tablet by mouth once a day 2)  Vitamin D3 1000 Unit Tabs (Cholecalciferol) .... Take one tablet daily x 3 months and then have vit d level rechecked 3)  Calcium 500 Mg Tabs (Calcium carbonate) .... Take one tablet two times a day 4)  Therapeutic T+plus Max St 3 % Sham (Salicylic acid) .... Massage into wet hair,  wait 5 minutes; rinse thoroughly.  use daily x1 week, then every other day.  disp #1 large bottle. 5)  Triamcinolone Acetonide 0.025 % Lotn (Triamcinolone acetonide) .... Apply to scalp two times a day x2 weeks.  disp #1 large bottle. 6)  Levaquin 750 Mg Tabs (Levofloxacin) .... Take 1 tab daily for 7 days   Primary Care Provider:  Bobby Rumpf  MD  CC:  pneumonia.  History of Present Illness: Seen in clinic on 3/10 with cough and fever.  Diagnosed with pneumonia.  CXR showed:  1.  Bilateral midlung airspace opacity, right greater than left, suspicious for multilobar pneumonia.  Follow-up radiography to ensure clearance and exclude malignancy is recommended. 2. Moderate sized hiatal hernia. 3.  Mild cardiomegaly.  Started on levaquin.  Since the 10th, he has continued to feel poorly.  not able to eat solids.  coughing such that he cannot sleep.  Tmax is 100.3.  Not really able to take care of himself.  So his mother brought him to the ER.  His CXR showed progressive pneumonia.    In the ED, he received azithro, rocephin, 250cc bolus.     Review of Systems General:  Complains of chills, fatigue, fever, loss of appetite, and weakness; denies weight loss. Resp:  Complains of chest pain with inspiration, cough, pleuritic, and shortness of breath; denies coughing up blood, hypersomnolence, and wheezing. GI:  Complains of diarrhea, loss of appetite, and nausea; denies bloody stools and vomiting.  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 Fiance is Research scientist (physical sciences). Marriage plans are in the works. Currently going to health club with his fiance.    Past Medical History: Reviewed history from 02/01/2008 and no changes required. Choledocholithiasis - June 2005,  Massive Pleural Effusion with loculation /empyema Cerebral palsy  Past Surgical History: Reviewed history from 02/01/2007 and no changes required. 17 surgeries for CP - 04/11/2002, Chest tube placement, L - 04/17/2003, Cholecystectomy, sphincterotomy - 07/18/2003, EKG NSR - 03/11/2006, lipid panel 02/08: tc=185, tg=117, hdl=50, ldl=112 - 03/22/2006, VATS, thoracotomy, decortication, L - 04/17/2003 Bilateral ankle surgery   Family History: Reviewed history from 02/01/2008 and no changes required. Mom - rapid heartbeat at one time No HTN or DM in the family. No cancer in the family.  Brother is healthy   Physical Exam  General:  coughing, laying in bed, uncomfortable Eyes:  normal appearance Mouth:  not able to get adequate visualization of oropharynx Neck:  limited ROM secondary to hx/o spinal fusion per pt.  Lungs:  rhonchi and wheezing throughout.  slightly decreased air movement.  mildy tachypneic, but no IC retractions Heart:  slightly tachy, no rubs, gallops, murmurs Abdomen:  SNTND.  +BS Msk:  hand contractures Extremities:  2+ dp pulses Neurologic:  no focal deficits Additional Exam:  WBC 4.9 hgb 15.0 hct 41.9 plt 181 diff WNL  Na 133 K+ 2.6 Cl 92 bicarb 29 gluc 117 BUN 5  Cr 0.84 Ca 8.7 Tprot 6.6 lactic acid 1.4  CXR:  progression of bilateral pneumonia   Appended Document: hospital admission for pneumonia    Clinical Lists Changes  Medications: Removed medication of LEVAQUIN 750 MG TABS (LEVOFLOXACIN) take 1 tab daily for 7 days

## 2010-03-18 NOTE — Letter (Signed)
Summary: Generic Letter  [facility]  [address]   [place], [place] [address]   Phone: [phone]  Fax: [phone]    06/18/2009  [name] [address] [place], [place]  [address]  To whom it may concern,  This letter is to certify that the above-named patient, [name] is unable to wear the required shoulder strap for SCAT services due to potential for choking given the patient's level of neck immobility. Please excuse this patient from having to comply with these new regulations. Please feel free to direct any questions to my office at the contact information provided above.      Sincerely,   Bobby Rumpf  MD

## 2010-03-18 NOTE — Assessment & Plan Note (Signed)
Summary: headache/nausea,df   Vital Signs:  Patient profile:   45 year old male Temp:     100.6 degrees F Pulse rate:   123 / minute BP sitting:   122 / 82  Vitals Entered By: Doree Albee MD (April 25, 2009 2:30 PM) CC: headache/nausea 4 days Is Patient Diabetic? No Pain Assessment Patient in pain? yes     Location: head Intensity: 8   Visit Type:  SDA Primary Care Provider:  Bobby Rumpf  MD  CC:  headache/nausea 4 days.  History of Present Illness: 86 YOM w/ PMHX/o CP and migraines presenting w/ 4 day hx/o nausea, headache and fevers. Pt states that symptoms beagn primarily as headache and rhinorrhea. Pt describes headache as like previous migraines in the past (photphobia and nausea). Toradol IM has greatly helped migraines in the past per pt.  Pt also reprting of subjective fevers at home as well as progressively worsening cough over last 2-3 days. Pt denies any increased WOB, dyspnea. Pt complaining pf increasing pleuritic CP.   Allergies: No Known Drug Allergies  Physical Exam  General:  cooperative to examination, in minimally distress secondary to persistent cough  Head:  normocephalic and atraumatic.   Eyes:  PERRL Ears:  TMs clear bilaterally Nose:  nasal erythema present.  Mouth:  post nasal drainage present,  minimal-moderate post oropharyngeal erythema present. No trismus noted.  Neck:  limited ROM secondary to hx/o spinal fusion per pt.  Lungs:  + rales/rhoncii in anterior upper lung fields bilaterally. Diminished breath sounds in lower lung fields L>R. No increased WOB noted.  Heart:  RRR, no rubs, gallops, murmurs   Impression & Recommendations:  Problem # 1:  PNEUMONIA, BILATERAL (ICD-486) Pt's baseline hx/o CP places pt at increased risk of pneumonia, as pt w/ baseline decreased lung capacity(self reported hx/o RLL pneumothorax per pt) and decreased air mov't. Given febrile presentation in clinic in setting of recent NSAID use as well as pulmonary  exam, will treat pt w/ clinical diagnosis of PNA. Pulse ox 91% in clinic. Pt overall comfortable w/ WOB oat baseline. Will also obtain CXR  to evaluate for extent of possible pulmonary disease. Plan for pt to followup in 1 week for clinical reassessment. Family/pt advised to return to [facility] if clinical condition worsens.  The following medications were removed from the medication list:    Levaquin 750-5 Mg/140ml-% Soln (Levofloxacin in d5w) .Marland Kitchen... Take 750 mg once daily for 5 days please take 1 hour before meals or 2 hours after meals to ensure effectivenss His updated medication list for this problem includes:    Levaquin 750 Mg Tabs (Levofloxacin) .Marland Kitchen... Take 1 tab daily for 7 days  Orders: [facility]- Est  Level 4 (96045)  Problem # 2:  HEADACHE (ICD-784.0) Likley migraine headache as pt reports previous migraine headaches as similar symptomatology. WIll give toradol IM in office as pt reports marked improvement in migraine headache w/ toradol.  Orders: Ketorolac-Toradol 15mg  (W0981)  Complete Medication List: 1)  Hydrochlorothiazide 12.5 Mg Tabs (Hydrochlorothiazide) .... Take 1 tablet by mouth once a day 2)  Vitamin D3 1000 Unit Tabs (Cholecalciferol) .... Take one tablet daily x 3 months and then have vit d level rechecked 3)  Calcium 500 Mg Tabs (Calcium carbonate) .... Take one tablet two times a day 4)  Therapeutic T+plus Max St 3 % Sham (Salicylic acid) .... Massage into wet hair, wait 5 minutes; rinse thoroughly.  use daily x1 week, then every other day.  disp #1 large  bottle. 5)  Triamcinolone Acetonide 0.025 % Lotn (Triamcinolone acetonide) .... Apply to scalp two times a day x2 weeks.  disp #1 large bottle. 6)  Levaquin 750 Mg Tabs (Levofloxacin) .... Take 1 tab daily for 7 days  Other Orders: Pulse Oximetry- [facility] (94760) CXR- 2view (CXR) Prescriptions: LEVAQUIN 750 MG TABS (LEVOFLOXACIN) take 1 tab daily for 7 days  #7 tabs x 0   Entered and Authorized by:   Doree Albee MD    Signed by:   Doree Albee MD on 04/25/2009   Method used:   Electronically to        [facility] [address] (retail)       [phone]       [place], [place]  [address]       Ph: [phone]       Fax: [phone]   RxID:   [id number] LEVAQUIN 750-5 MG/150ML-% SOLN (LEVOFLOXACIN IN D5W) take 750 mg once daily for 5 days please take 1 hour before meals or 2 hours after meals to ensure effectivenss  #5 days (qs) x 0   Entered and Authorized by:   Doree Albee MD   Signed by:   Doree Albee MD on 04/25/2009   Method used:   Electronically to        [facility] [address] (retail)       [phone]       [place], [place]  [address]       Ph: [phone]       Fax: [phone]   RxID:   [id number]    Medication Administration  Injection # 1:    Medication: Ketorolac-Toradol 15mg     Diagnosis: HEADACHE (ICD-784.0)    Route: IM    Site: RUOQ gluteus    Exp Date: 09/17/2010    Lot #: [id number]    Mfr: hospira    Comments: 60mg /38ml given    Patient tolerated injection without complications    Given by: Tessie Fass CMA (April 25, 2009 3:45 PM)  Orders Added: 1)  Pulse Oximetry- [facility] [94760] 2)  Ketorolac-Toradol 15mg  [J1885] 3)  [facility]- Est  Level 4 [09323] 4)  CXR- 2view [CXR]

## 2010-05-11 LAB — URINE MICROSCOPIC-ADD ON

## 2010-05-11 LAB — URINE CULTURE
Colony Count: NO GROWTH
Culture: NO GROWTH

## 2010-05-11 LAB — CBC
HCT: 35.9 % — ABNORMAL LOW (ref 39.0–52.0)
Hemoglobin: 12.8 g/dL — ABNORMAL LOW (ref 13.0–17.0)
Hemoglobin: 15 g/dL (ref 13.0–17.0)
MCHC: 34.8 g/dL (ref 30.0–36.0)
MCHC: 35.7 g/dL (ref 30.0–36.0)
MCHC: 35.8 g/dL (ref 30.0–36.0)
MCV: 87.3 fL (ref 78.0–100.0)
Platelets: 151 10*3/uL (ref 150–400)
Platelets: 172 10*3/uL (ref 150–400)
Platelets: 181 10*3/uL (ref 150–400)
RBC: 4.11 MIL/uL — ABNORMAL LOW (ref 4.22–5.81)
RDW: 12.4 % (ref 11.5–15.5)
RDW: 12.7 % (ref 11.5–15.5)
WBC: 3.8 K/uL — ABNORMAL LOW (ref 4.0–10.5)

## 2010-05-11 LAB — DIFFERENTIAL
Basophils Absolute: 0 10*3/uL (ref 0.0–0.1)
Basophils Absolute: 0 10*3/uL (ref 0.0–0.1)
Basophils Relative: 0 % (ref 0–1)
Eosinophils Absolute: 0 10*3/uL (ref 0.0–0.7)
Eosinophils Absolute: 0 K/uL (ref 0.0–0.7)
Eosinophils Relative: 0 % (ref 0–5)
Eosinophils Relative: 0 % (ref 0–5)
Lymphocytes Relative: 15 % (ref 12–46)
Lymphocytes Relative: 26 % (ref 12–46)
Lymphs Abs: 1 K/uL (ref 0.7–4.0)
Monocytes Absolute: 0.4 10*3/uL (ref 0.1–1.0)
Monocytes Relative: 11 % (ref 3–12)
Neutro Abs: 2.4 K/uL (ref 1.7–7.7)
Neutro Abs: 3.6 10*3/uL (ref 1.7–7.7)
Neutrophils Relative %: 63 % (ref 43–77)

## 2010-05-11 LAB — COMPREHENSIVE METABOLIC PANEL
ALT: 29 U/L (ref 0–53)
ALT: 35 U/L (ref 0–53)
AST: 52 U/L — ABNORMAL HIGH (ref 0–37)
Albumin: 2.9 g/dL — ABNORMAL LOW (ref 3.5–5.2)
Alkaline Phosphatase: 102 U/L (ref 39–117)
Alkaline Phosphatase: 71 U/L (ref 39–117)
BUN: 5 mg/dL — ABNORMAL LOW (ref 6–23)
CO2: 29 mEq/L (ref 19–32)
Calcium: 7.7 mg/dL — ABNORMAL LOW (ref 8.4–10.5)
Calcium: 8.7 mg/dL (ref 8.4–10.5)
Creatinine, Ser: 0.84 mg/dL (ref 0.4–1.5)
Potassium: 2.6 mEq/L — CL (ref 3.5–5.1)
Potassium: 3.1 mEq/L — ABNORMAL LOW (ref 3.5–5.1)
Sodium: 136 mEq/L (ref 135–145)
Total Protein: 5.1 g/dL — ABNORMAL LOW (ref 6.0–8.3)

## 2010-05-11 LAB — CULTURE, RESPIRATORY W GRAM STAIN

## 2010-05-11 LAB — URINALYSIS, ROUTINE W REFLEX MICROSCOPIC
Hgb urine dipstick: NEGATIVE
Leukocytes, UA: NEGATIVE
Specific Gravity, Urine: 1.021 (ref 1.005–1.030)
pH: 6 (ref 5.0–8.0)

## 2010-05-11 LAB — BASIC METABOLIC PANEL
BUN: 2 mg/dL — ABNORMAL LOW (ref 6–23)
Calcium: 8.6 mg/dL (ref 8.4–10.5)
Creatinine, Ser: 0.57 mg/dL (ref 0.4–1.5)
GFR calc Af Amer: >60 mL/min (ref >60)
GFR calc non Af Amer: >60 mL/min (ref >60)

## 2010-05-11 LAB — CULTURE, BLOOD (ROUTINE X 2)

## 2010-05-11 LAB — EXPECTORATED SPUTUM ASSESSMENT W GRAM STAIN, RFLX TO RESP C

## 2010-07-04 NOTE — H&P (Signed)
[name], [name]                       ACCOUNT NO.:  [id number]   MEDICAL RECORD NO.:  [id number]                   PATIENT TYPE:  INP   LOCATION:  3313                                 FACILITY:  [facility]   PHYSICIAN:  Lawerance Sabal, MD                   DATE OF BIRTH:  12/02/1965   DATE OF ADMISSION:  05/03/2003  DATE OF DISCHARGE:                                HISTORY & PHYSICAL   Audio too short to transcribe (less than 5 seconds)                                                Lawerance Sabal, MD    MC/MEDQ  D:  05/08/2003  T:  05/08/2003  Job:  [id number]

## 2010-07-04 NOTE — Op Note (Signed)
NAMEHESTER, FORGET                       ACCOUNT NO.:  [id number]   MEDICAL RECORD NO.:  [id number]                   PATIENT TYPE:  INP   LOCATION:  5003                                 FACILITY:  [facility]   PHYSICIAN:  Ines Bloomer, M.D.              DATE OF BIRTH:  07-17-1965   DATE OF PROCEDURE:  05/08/2003  DATE OF DISCHARGE:                                 OPERATIVE REPORT   PREOPERATIVE DIAGNOSIS:  1. Left lower lobe pneumonia with empyema.  2. Cerebral palsy.   POSTOPERATIVE DIAGNOSIS:  1. Left lower lobe pneumonia with empyema.  2. Cerebral palsy.   OPERATION PERFORMED:  Left VATS, left thoracotomy, drainage of empyema with  decortication.   SURGEON:  Ines Bloomer, M.D.   ANESTHESIA:  General.   After percutaneous insertion of all monitoring lines, the patient underwent  general anesthesia.  He was prepped and draped in the usual sterile fashion.  The patient was a difficult intubation so a single lumen tube was inserted  rather than a dual lumen tube.  The patient was turned to the left lateral  thoracotomy position.  He is a 45 year old patient with cerebral palsy, had  a left lower lobe pneumonia and it was felt he had developed an empyema.  The previous chest tube that had been placed was removed and two trocar  sites were made in the anterior and posterior axillary line at the seventh  intercostal space.  Two trocars were inserted.  There was marked empyema in  the lung with marked reaction.  Fluid was sent for culture and the exudate  was removed with ring forceps and sent for culture.  Using the 0 degree  scope, the lung was lung was dissected off the lateral wall and medial wall  and superiorly as much as possible.  There was obviously a thickened peel  over the lung and open thoracotomy had to be done to remove the thickened  peel.  A 7 cm incision was made over the sixth intercostal space with the  latissimus being partially divided and the  serratus reflected anteriorly.  The sixth intercostal space and two retractors placed at right angles.  I  started posteriorly dissecting up the costophrenic angle and removing  thickened pleura and then the thickened pleura was taken laterally and  medially off the lateral wall and then superiorly off the wall.  After this  had all been done, attention was turned medially and was stuck on the left  lingula, was stuck on the epicardial fat which was markedly inflamed, and  this was dissected free from the fat, and the fat was resected.  Then, the  cardiophrenic angle was cleared up, taking the left lower lobe off the  cardiophrenic angle and completely freeing it up.  All inflammatory tissue  was removed and after the lung had been completely freed up, the fissure was  completely freed with sharp dissection and  decortication was done of the  left lower lobe removing a thickened peel off the left lower lobe with  forceps and sharp dissection and blunt dissection, also using Kitners to  remove it.  After the left lower lobe had been completely freed up, there  were several small tears of the lung which was oversewn with 3-0 Vicryl.  Two chest tubes were placed through the trocar sites and tied in place with  0 silk.  These were number 36 chest tubes.  A third incision was made  between them and a right angle 36 was inserted and sutured in place with 0  silk.  A Marcaine block was done in the usual fashion.  The chest was closed  with three pericostals, double 0 chromic, and  under the pericostals was placed two catheters which were sutured in place  with 3-0 silk.  The muscle layer was closed with #1 Vicryl, the subcutaneous  tissue with 2-0 Vicryl, and Dermabond for the skin.  The patient returned to  the recovery room in stable condition.                                               Ines Bloomer, M.D.    DPB/MEDQ  D:  05/08/2003  T:  05/09/2003  Job:  [id number]

## 2010-07-04 NOTE — Discharge Summary (Signed)
[name], [name]                       ACCOUNT NO.:  [id number]   MEDICAL RECORD NO.:  [id number]                   PATIENT TYPE:  INP   LOCATION:  5740                                 FACILITY:  [facility]   PHYSICIAN:  [name], M.D.          DATE OF BIRTH:  Jul 03, 1965   DATE OF ADMISSION:  08/10/2003  DATE OF DISCHARGE:  08/15/2003                                 DISCHARGE SUMMARY   PRIMARY CARE PHYSICIAN:  Lawerance Sabal, M.D.   CONSULTATIONS:  1. Althea Grimmer. Luther Parody, M.D. with gastroenterology.  2. Velora Heckler, M.D. __________ surgery.   FINAL DIAGNOSES:  1. Choledocholithiasis.  2. Status post endoscopic retrograde cholangiopancreatography.  3. Status post laparoscopic cholecystectomy.  4. Cerebral palsy.  5. Status post left pleural effusion thought to be parapneumonic in March     2005.   PRINCIPAL PROCEDURES:  1. ERCP which showed massive number of stones, greater than 50, in the     common bile duct.  2. Operative cholangiogram.  3. Laparoscopic cholecystectomy.   ADMISSION HISTORY AND PHYSICAL:  Please see chart for full details.  However, in short, [name] is a 45 year old white male with functional  cerebral palsy who had nausea and vomiting on Tuesday night that got better.  However, his nausea and vomiting returned on the night of admission.  He had  five episodes of emesis.  There were no coffee grounds.  He described his  pain as a 7/10 in the upper right quadrant.  He also had positive  constipation with his last bowel movement being several days prior to  admission.  He had had no recent travel.  He had had no sick contacts and no  recent antibiotics or other medication.   LABORATORY DATA:  At the time of discharge, amylase was 68, lipase 23,  sodium 141, potassium 3.7, chloride 103, CO2 29, BUN 2, creatinine 0.7,  total bilirubin 4.6, alk phos 199, AST 205, ALT 289, protein 5.1, albumin  2.9, calcium 9.  WBC 5.9, hemoglobin 13.6,  hematocrit 38.3, platelets 189,  MCV 2.3.  Urinalysis positive for nitrites and negative for leukocyte  esterase, greater than 80 ketones and had large bilirubin.  Urine microscopy  showed 3-6 wbcs; otherwise negative.   HOSPITAL COURSE:  1. Choledocholithiasis.  Given that the patient upon admission had elevated     total bilirubin to 6.9 and alk phos to 241 with elevated liver functions,     we are concerned for either acute hepatitis or cholecystitis picture.  He     was evaluated by surgery and ultrasound of the abdomen showed a distended     gallbladder filled with stones and sludge and intrahepatic biliary     dilation.  The patient was then evaluated by GI for possible ERCP.  He     was then taken to ERCP by Dr. Luther Parody and was found to have greater     than 50  stones in the common bile duct.  A sphincterotomy was performed     and these stones were removed without complications.  The patient's total     bilirubin, liver enzymes, amylase, lipase, and alk phos trended downwards     after this procedure and within 24 to 48 hours after the ERCP, the     patient was taken to the operating room where a laparoscopic     cholecystectomy was performed and the patient tolerated the procedure     without complications.  The patient showed no advanced pancreatitis     secondary to the above procedures and at the time of discharge all of his     elevated enzymes were trending downwards.  He will follow up with his     primary physician, Dr. Evonnie Pat, for further laboratory studies and also     with the surgeons to verify the continued downward trend.  His pain was     under better control and he was tolerating a regular diet at the time of     discharge.  He was stooling and ambulating well at the time of discharge.   1. Cerebral palsy.  This was stable at the time of discharge.  He needs     p.r.n. Lioresal for muscle spasms as needed.   DISCHARGE INSTRUCTIONS:  1. The patient was  instructed to follow up with Dr. Lawerance Sabal on August 27, 2003, at 11:30 a.m. and he was instructed to call Dr. Ardine Eng office     at [phone] for an appointment.  2. He was instructed to remove the gauze and was instructed and was     instructed that he could shower.  However, to leave the Steri-Strips in     place for one week.  He was instructed to call Dr. Ardine Eng office if he     had any complications.   DISCHARGE MEDICATIONS:  1. Vicodin 500/5 one to two pills q.4h. p.r.n. pain.  2. Lioresal 10 mg three times a day  p.o. p.r.n. muscle spasms.   DNR status full code.      Penni Bombard, MD                          Randon Goldsmith. [name], M.D.    SJ/MEDQ  D:  08/15/2003  T:  08/15/2003  Job:  [id number]   cc:   Lawerance Sabal, MD  Fax: [phone]

## 2010-07-04 NOTE — Op Note (Signed)
NAMEGILBERT, MANOLIS                       ACCOUNT NO.:  [id number]   MEDICAL RECORD NO.:  [id number]                   PATIENT TYPE:  INP   LOCATION:  5715                                 FACILITY:  [facility]   PHYSICIAN:  Petra Kuba, M.D.                 DATE OF BIRTH:  11/15/1965   DATE OF PROCEDURE:  DATE OF DISCHARGE:                                 OPERATIVE REPORT   DATE OF PROCEDURE:  October 19, 2003.   PROCEDURE PERFORMED:  ERCP.   INDICATION:  Patient with obstructive jaundice, per Dr. Luther Parody.  Consent  was signed per conservation's with Dr. Luther Parody.   PROCEDURE:  I was in the Endo Unit, and Dr. Luther Parody requested me to assist  with his ERCP, although he had had a previous ERCP and sphincterotomy and  multiple stone removals, which had gone very smoothly.  He now presents with  probable obstructive jaundice, and Dr. Luther Parody was unable to maintain  access to the duct, and he asked me to try.  I took over the controls and on  initial probing with the tapered sphincterotome at the most proximal area to  what we thought was the previous sphincterotomy site, we were able to what  we thought was pop very easily into the duct with very minimal pressure.  The Al Pimple was advanced and seemed to take a fairly reasonable turn,  although we could not advance it deep into the ducts, and injecting dye, no  dye was seen.  We thought possibly the dye was just too dilute, so we  changed dye catheters and were unsuccessful.  While we did that, we did  exchange the tapered sphincterotome for the triple lumen sphincterotome over  the wire in the customary fashion.  Once we had done that and reinjected dye  again, we did not see any dye being delivered from the catheter.  Although  we were concerned at this juncture for a perforation, we were confused in  that the wire when we withdrew it would still always go in the exact same  direction and have the same bends.  Possibly, this  was compatible with a  very dilated, tortuous duct.  We went ahead and exchanged the catheter for  the 8.5-mm balloon in an effort to possibly get an occlusion cholangiogram  of some sort to better visualize the dye, but this was unsuccessful as well.  Dr. Loralie Champagne agreed there was no dye going into the duct system, but  he could not say where the dye was going, and at this juncture we elected to  stop the procedure, and the scope was removed.  Throughout the procedure,  patient tolerated the procedure well, although it did appear that this  procedure was complicated with a duodenal perforation.   PLAN:  Get a stat CT scan with further workup and plan, surgical consult,  and possible PTC, per Dr. Luther Parody, and  I will assist as I can.                                               Petra Kuba, M.D.   MEM/MEDQ  D:  10/19/2003  T:  10/20/2003  Job:  [id number]   cc:   Althea Grimmer. Luther Parody, M.D.  [address], [address]  [place]  [place] [address]  Fax: [phone]

## 2010-07-04 NOTE — Discharge Summary (Signed)
[name], [name]                       ACCOUNT NO.:  [id number]   MEDICAL RECORD NO.:  [id number]                   PATIENT TYPE:  INP   LOCATION:  5004                                 FACILITY:  [facility]   PHYSICIAN:  Pearlean Brownie, M.D.            DATE OF BIRTH:  09-Jun-1965   DATE OF ADMISSION:  05/03/2003  DATE OF DISCHARGE:  05/15/2003                                 DISCHARGE SUMMARY   DISCHARGE DIAGNOSES:  1. Massive left pleural effusion with loculation and empyema, probably     parapneumonic.  2. Status post chest tube placement on the left.  3. Status post video-assisted thoracic surgery, thoracotomy, decortication.  4. Cerebral palsy.   DISCHARGE MEDICATIONS:  1. Augmentin 875 mg p.o. b.i.d. for 3 more weeks.  2. Tylenol 325 mg p.o. every 6 hours as necessary for fever or pain.  3. Senokot 1 tablet p.o. at bedtime for constipation.  4. Lioresal 10 mg p.o. every 8 hours as necessary for muscle spasm.  5. Tramadol 50 mg p.o. every 6 ours as necessary for pain.   DISCHARGE INSTRUCTIONS:  1. Regular diet.  Resource wild berry liquid 240 mL b.i.d.  2. Wound care as instructed.  3. Incentive spirometry 4 breaths every 2 hours.   DISPOSITION:  The patient was discharged to home.   FOLLOWUP INSTRUCTIONS:  1. With Dr. Lawerance Sabal at the [facility] on May 29, 2003 at     1:45 p.m.  2. With Dr. Ines Bloomer on May 23, 2003 at 2:20 p.m.  Have an x-ray     done at the [facility] at 1:20 p.m. and bring x-ray     film to the office.   CONSULTATIONS:  Dr. Edwyna Shell, [facility].   PROCEDURES DONE:  1. Chest tube placement on the left.  2. Video-assisted thoracic surgery, thoracotomy, decortication.   REVIEW OF HISTORY:  This is a 45 year old white male with cerebral palsy,  who presented with progressive shortness of breath for the past 3 weeks.  The patient has been previously treated for pneumonia on an outpatient basis  for the past  2 months with 2 antibiotic courses.  However, because of the  shortness of breath, the patient consulted at the [facility] and  a chest CT showed massive left pleural effusion with mediastinal shift.  On  admission, blood pressure was 131/81, pulse rate of 133, respiratory rate of  22, O2 saturation of 95% on room air.  The patient was awake, alert, with  slurred speech but not in distress.  There were decreased breath sounds on  the left lung field with decreased vocal and tactile fremitus on the left  lung field.  No crackles, no wheezes.  The right lung field had good air  movement.  The rest of the physical exam was essentially normal except for  flexion contractures of both wrists.   LABORATORY DATA ON ADMISSION:  Hemoglobin  14.7, hematocrit 43.7, WBC count  of 13.5, platelets of 411,000.   COURSE IN THE HOSPITAL:  PROBLEM #1 - MASSIVE LEFT PLEURAL EFFUSION,  PROBABLY PARAPNEUMONIC:  A referral to [facility] was done and a chest tube was  inserted on the left.  Pleural fluid studies were sent to the lab.  There  was note of clear-yellowish pleural fluid draining per chest tube.  The  patient was maintained on oxygen per nasal cannula to maintain O2  saturations at more than 90%.  On the second hospital day, the patient got  fever up to 100.1.  The patient was started on Avelox, which was later  shifted to Zosyn IV.  Pleural fluid Gram's stain culture showed no organisms  and no growth.  A chest x-ray done after the chest tube was inserted showed  loculations.  A CT scan was done which showed left lower lobe atelectasis  with effusion.  On the 5th hospital day, the patient underwent a left VATS,  left thoracotomy, drainage of empyema with decortication.  Postoperative  diagnosis was left lower lobe pneumonia with empyema.  The patient tolerated  the procedure well.  Two chest tubes were placed during the operation and  were eventually removed postop.  The patient developed fever  postop, which  eventually resolved.  Zosyn was discontinued after the patient became  afebrile for more than 2 days.  The patient was then started on Augmentin  875 mg p.o. b.i.d., which he will continue for 3 more weeks after discharge.   PROBLEM #2 - PAIN MANAGEMENT:  The patient was maintained on morphine PCA  postoperatively with good pain control.   PROBLEM #3 - REHABILITATION:  The patient underwent physical therapy and OT  evaluation.  The patient was referred to the social worker to set up home  health PT and OT.   CONDITION ON DISCHARGE:  The patient was discharged with no complaints, with  stable vital signs, afebrile.  Operative site was noted to be dry with no  discharge and no erythema.  On auscultation, there were note of decreased  breath sounds in the left basal lung fields with no crackles and no wheezes.  The patient had stable O2 saturations at 93% to 94% on room air.  The  patient was advised to continue his medications and to follow up with Dr.  Evonnie Pat and Dr. Edwyna Shell as instructed.   OTHER PERTINENT LABORATORIES:  Latest chest x-ray done May 14, 2003:  No  evidence of pneumothorax.  Persistent left lower lobe and lingula airspace  disease and effusion.   CT of the chest on May 05, 2003 showed bilateral atelectasis with large  left and small right pleural effusions.  Favor atelectasis rather than  pneumonia as underlying cause of airspace opacity, although if there is  residual pneumonia, it is probably in the left lower lobe.   Pleural fluid cell count:  Wbc's 5500, neutrophils 30, lymphocytes 67,  monocytes and macrophages 3, pleural fluid protein 4.4, glucose 76.  Pleural  fluid Gram's stain showed a few wbc's, predominantly PMNs, no organism seen,  no growth after 3 days.  Pleural fluid AFB culture and smear:  No acid-fast  bacilli seen.  Pleural fluid fungal cultures and smear showed no yeast or fungal elements.  Pleural fluid thin prep, cell blocks  showed mixed  inflammatory  cells with no evidence of malignancy.  Pleural biopsy showed pleura with  mesothelial hyperplasia, fibrosis and scattered reactive lymphoid follicles.  Lung biopsy showed  benign lung parenchyma entrapped in mesothelial fibrosis  and inflammation with associated fibrinous pleural exudate, no evidence of  malignancy.      Lawerance Sabal, MD                         Pearlean Brownie, M.D.    MC/MEDQ  D:  05/15/2003  T:  05/16/2003  Job:  [id number]   cc:   Ines Bloomer, M.D.  [address]  [place]  [place] [address]

## 2010-07-04 NOTE — Consult Note (Signed)
NAMEWILFREDO, [name]                       ACCOUNT NO.:  [id number]   MEDICAL RECORD NO.:  [id number]                   PATIENT TYPE:  INP   LOCATION:  5715                                 FACILITY:  [facility]   PHYSICIAN:  [name], M.D.                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   HISTORY OF PRESENT ILLNESS:  The patient is a 45 year old white male well-  known to my practice.  He had presented in June 2005 to the emergency  department with abdominal pain and jaundice.  He was seen and evaluated.  He  was found to have cholelithiasis and choledocholithiasis.  He underwent ERCP  with stone extraction by Dr. Roosvelt Santiago.  He was then prepared and  taken to the operating room on August 13, 2003 where he underwent laparoscopic  cholecystectomy with intraoperative cholangiography.  Postoperatively the  patient did well and was discharged home.  However at his followup visits he  was noted to have persistent abnormalities of his liver function tests.  He  was seen by Dr. Luther Santiago.  Due to recurrent abdominal pain and abnormal  liver function tests he underwent ERCP earlier today.  At the time ERCP  examination he was found to have scarring at the sphincterotomy with  stenosis of the ampulla.  With the assistance of Dr. Vida Santiago a further  attempt was made at cannulating the ampulla.  However a perforation of the  duodenum was created with the catheter into the retroperitoneum behind the  head of the pancreas.  Dye was injected.  Subsequent CT scan demonstrated  extraluminal dye.  The case was discussed with surgery and the procedure was  terminated.  The patient was admitted to the hospital on bowel rest,  intravenous fluids and  prophylactic antibiotics.  General Surgery is now  asked to become involved with his care.   PAST MEDICAL HISTORY:  1.  Status post laparoscopic cholecystectomy June 2005.  2.  History of cerebral palsy.  3.  Status post cervical spine fusion from C1 to C5 posteriorly.  4.  Status post spontaneous pneumothorax with pulmonary stapling March 2005      by Dr. Ashby Santiago from [facility].  5.  Status post bladder suspension for urinary incontinence.  6.  History of difficult airway.   MEDICATIONS:  Orphenadrine muscle relaxant p.r.n.   ALLERGIES:  NONE KNOWN.   SOCIAL HISTORY:  The patient lives in [place].  He is accompanied by his  mother.  He is able to live alone.  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  He denies tobacco use.  He denies alcohol use.  He denies  illicit drug use.  REVIEW OF SYSTEMS:  A 15 system review without significant positives except  as noted above.  Recent history of jaundice noted.   FAMILY HISTORY:  Noncontributory.   PHYSICAL EXAMINATION:  GENERAL:  A 45 year old well-developed, well-  nourished white male, in no acute distress.  VITAL SIGNS:  Show a temp99.2, T-max was 102.4, pulse 84, respirations 20,  blood pressure 99/64.  O2 saturation is  95% on room air.  HEENT:  Shows him to be normocephalic, sclerae are slightly icteric.  Pupils  are equal and reactive.  Dentition is fair.  Mucous membranes are moist.  NECK:  Supple without mass, thyroid is normal without nodularity.  LUNGS:  Clear to auscultation bilaterally.  CARDIAC:  Exam shows a regular rate and rhythm.  ABDOMEN:  Soft with mild distention.  There are bowel sounds present.  There  is mild tenderness in the epigastrium and right upper quadrant.  There is no  guarding.  There is no rebound.  EXTREMITIES:  Nontender without edema.  NEUROLOGIC:  The patient is alert and oriented.   LABORATORY STUDIES:  White count 4.9, hemoglobin 14.6, platelet count  198,000, electrolytes  show a low potassium of 3.1, liver function tests show  total bilirubin 9.6, alkaline phosphatase 246, SGOT 121, SGPT 160, lipase  normal at 16.   RADIOGRAPHIC STUDIES:  CT scan abdomen and pelvis to be reviewed.   IMPRESSION:  1.  Contained perforation of duodenum into retroperitoneum during attempted      ERCP.  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  3.  Biliary obstruction.  4.  History of cholecystectomy.   PLAN:  1.  Agree with admission, n.p.o. status, intravenous hydration.  2.  Prophylactic intravenous Unasyn.  3.  Repeat ct scan abdomen and pelvis as indicated by clinical parameters.  4.  Probable percutaneous transhepatic cholangiography next week to evaluate      biliary obstruction and possibly dilate ampulla.  5.  The patient may require urgent surgical intervention if his clinical      course deteriorates.                                               [name], M.D.    TMG/MEDQ  D:  10/19/2003  T:  10/20/2003  Job:  [id number]   cc:   [name]. [name], M.D.  [address]  [place]  [place] [address]  Fax: [phone]

## 2010-07-04 NOTE — Op Note (Signed)
[name], [name]                       ACCOUNT NO.:  [id number]   MEDICAL RECORD NO.:  [id number]                   PATIENT TYPE:  INP   LOCATION:  5715                                 FACILITY:  [facility]   PHYSICIAN:  Velora Heckler, M.D.                DATE OF BIRTH:  04/17/1965   DATE OF PROCEDURE:  10/19/2003  DATE OF DISCHARGE:                                 OPERATIVE REPORT   PREOPERATIVE DIAGNOSES:  Need for intravenous access, contained perforation  duodenum.   POSTOPERATIVE DIAGNOSES:  Need for intravenous access, contained perforation  duodenum.   PROCEDURE:  Placement of right subclavian triple-lumen, central venous  catheter.   SURGEON:  Velora Heckler, M.D.   ANESTHESIA:  Local 1% lidocaine.   ESTIMATED BLOOD LOSS:  Minimal.   PREPARATION:  Betadine.   COMPLICATIONS:  None.   INDICATIONS FOR PROCEDURE:  Patient is a 45 year old white male well-known  to my practice. The patient was admitted after attempted ERCP with  complication of duodenal perforation into the retroperitoneum.  The patient  is to be placed at bowel rest with prophylactic antibiotic therapy.  Due to  his cerebral palsy and multiple prior procedures, he is a difficult IV  access. Therefore, central venous access is desired.   DESCRIPTION OF PROCEDURE:  The procedure was done at the bedside on ward  5700.  The patient was placed in the Trendelenburg position, prepped and  draped in the usual, aseptic fashion. The skin of the right subclavian area  was anesthetized with local anesthetic. Using an 18 gauge, Seldinger needle,  the right subclavian vein was entered in a single pass.  Soft  J-shaped guide wire was inserted and the needle removed.  Catheter tract is  dilated.   A triple-lumen CVP catheter was then inserted to 19 cm at the skin and the  guide wire removed. Catheter was secured to the skin with  3-0 silk sutures. Sterile dressings are applied.  Each of the lumina of the  catheter are then aspirated and flushed with saline.  STAT portable chest x-  ray will be obtained to confirm line placement prior to hooking up IV fluids  to the device.  The patient tolerated the procedure well.                                               Velora Heckler, M.D.    TMG/MEDQ  D:  10/19/2003  T:  10/21/2003  Job:  [id number]

## 2010-07-04 NOTE — Consult Note (Signed)
NAMEIDRIS, EDMUNDSON                       ACCOUNT NO.:  [id number]   MEDICAL RECORD NO.:  [id number]                   PATIENT TYPE:  INP   LOCATION:  5707                                 FACILITY:  [facility]   PHYSICIAN:  Althea Grimmer. Luther Parody, M.D.            DATE OF BIRTH:  10-15-1965   DATE OF CONSULTATION:  08/10/2003  DATE OF DISCHARGE:                                   CONSULTATION   [name] is a 45 year old male with cerebral palsy.  He is wheelchair  bound and has contractures of his hands.  He is also status post cervical  fusion because of instability of his neck and spine.  He presented to the  emergency room today complaining of upper abdominal pain and pain in the  intrascapular area of his back.  He may have had similar discomfort in the  past which he thought was muscle spasm.  However, today it was noted that he  was markedly jaundiced.  Over the past week, he has had intermittent  worsening pains and nausea and stomach problems and dark urine.  An  ultrasonogram in the emergency room today demonstrated common bile duct of 5  mm with minimal intrahepatic ductal dilation and a distended gallbladder  filled with stones. The patient received IV morphine, and he says the  discomfort is markedly improved.   PAST MEDICAL HISTORY:  Notable for all of the above.  In addition, he  recently in March had pneumonia with a left empyema requiring thoracotomy  and decortication by Dr. Edwyna Shell.  It is also reported that he is a difficult  intubation secondary to his neck surgery and inability to open his mouth.  He says that he has had 17 cerebral palsy surgeries as well.   CURRENT MEDICATIONS:  Lioresal for muscle spasms.   ALLERGIES:  None reported.   FAMILY HISTORY:  Negative for other family members with gallstones.   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  He does not smoke or drink.   REVIEW OF SYSTEMS:  GENERAL:   No weight loss or night sweats.  ENDOCRINE:  No history of diabetes or thyroid problems.  SKIN:  No rash.  His eyes are  markedly icteric, but only recently.  EYES:  No change in vision.  ENT:  No  aphthous ulcers or chronic sore throat.  RESPIRATORY:  No shortness of  breath, cough, or wheezing, recent pneumonia with thoracotomy for empyema.  HEART:  No known cardiac disorders or valvular heart disease.  GASTROINTESTINAL:  As above.  GENITOURINARY:  No dysuria or hematuria.   PHYSICAL EXAMINATION:  GENERAL:  He is a well-developed, disabled male with  upper arm contractures.  SKIN:  Mildly icteric.  EYES:  Markedly icteric.  OROPHARYNX:  Unremarkable.  He cannot open his mouth very widely.  NECK:  Without adenopathy and cannot be rotated.  CHEST:  Sounds clear.  HEART SOUNDS:  Regular rate and rhythm  ABDOMEN:  Mildly overweight with minimal epigastric and right upper quadrant  tenderness to deep palpation. Bowel sounds are positive.  No rebound.  RECTAL EXAM:  Not performed.  EXTREMITIES:  Without edema.   LABORATORY TESTS:  Hemoglobin 15.9, white blood count 6.5, platelet count  228,000.  BUN 8, electrolytes normal.  Total bilirubin 15.4, 7.7 indirect,  alkaline phosphatase 314, SGOT 301, SGPT 309, albumin 4.1.  Ammonia,  amylase, and lipase are all normal.   IMPRESSION:  A 45 year old male with cholelithiasis who most likely has  choledocholithiasis with obstruction.  He appears very stable at present.  Endoscopic retrograde cholangiopancreatography is indicated to relieve the  obstruction.  He will require cholecystectomy on this admission.   PLAN:  Endoscopic retrograde cholangiopancreatography is reviewed with the  patient and his mother in terms of technique, preparation, and risk of  complications including bleeding, perforation, and pancreatitis.  They agree  to proceed, and it is scheduled for 9 a.m. tomorrow.  Please see the orders.                                                Althea Grimmer. Luther Parody, M.D.    PJS/MEDQ  D:  08/10/2003  T:  08/10/2003  Job:  [id number]   cc:   Leighton Roach McDiarmid, M.D.  Fax: [phone]   Emi Holes., M.D.  [address]  [place], [place] [address]  Fax: [phone]

## 2010-07-04 NOTE — Op Note (Signed)
NAMEGIORDAN, [name]                       ACCOUNT NO.:  [id number]   MEDICAL RECORD NO.:  [id number]                   PATIENT TYPE:  INP   LOCATION:  5740                                 FACILITY:  [facility]   PHYSICIAN:  [name], M.D.                DATE OF BIRTH:  05-16-1965   DATE OF PROCEDURE:  08/13/2003  DATE OF DISCHARGE:                                 OPERATIVE REPORT   PREOPERATIVE DIAGNOSIS:  Cholelithiasis, choledocholithiasis.   POSTOPERATIVE DIAGNOSIS:  Cholelithiasis, choledocholithiasis.   PROCEDURE:  Laparoscopic cholecystectomy with interoperative  cholangiography.   SURGEON:  [name], M.D.   ANESTHESIA:  General.   ESTIMATED BLOOD LOSS:  Minimal.   PREPARATION:  Betadine.   COMPLICATIONS:  None.   INDICATIONS FOR PROCEDURE:  The patient is a 45 year old white male admitted  from the emergency department with obstructive jaundice.  The patient had  cholelithiasis.  He had choledocholithiasis.  He underwent ERCP with  extraction of at least 75 to 100 stones from the common bile duct by Dr.  [facility].  The patient now comes to surgery for  cholecystectomy.   DESCRIPTION OF PROCEDURE:  The procedure was done in OR 17 at [facility].  The patient was brought to the operating room and placed  in a supine position on the operating table.  The patient has a difficult  airway due to a history of cerebral palsy and spinal fusion of the cervical  spine.  Dr. Judie Petit managed the airway.  The patient was induced  with general anesthesia.  He was then prepped and draped in the usual strict  aseptic fashion.  After ascertaining an adequate level of anesthesia had  been obtained, an infraumbilical incision was made with a #15 blade.  Dissection was carried down to the fascia.  The fascia was incised in the  midline and the peritoneal cavity was entered cautiously.  A 0 Vicryl purse-  string suture was placed in  the fascia.  A Hasson cannula was introduced  under direct vision and secured with the purse-string suture.  The abdomen  was insufflated with carbon dioxide.  The laparoscope was introduced and the  abdomen was explored.  The operative ports were placed along the right  costal margin in the midline, midclavicular line, and anterior axillary  line.  The fundus of the gallbladder was grasped and retracted cephalad.  Omental adhesions to the gallbladder are gently taken down and hemostasis  obtained with the electrocautery.  The gallbladder is quite large and  redundant.  With some difficulty, the neck of the gallbladder is exposed.  What appears to be the cystic duct is dissected out.  A clip was placed at  the neck of the gallbladder and the cystic duct was incised.  Cook  cholangiography catheter was introduced through a stab wound and was  inserted into the cystic duct and  secured with a Liga clip.  Using C-arm  fluoroscopy, real time cholangiography was performed.  There is filling of  the cystic duct.  There was rapid filling of the normal caliber common bile  duct with free flow into the duodenum without obstruction or filling defect.  There was reflux of contrast into the right and left hepatic ductal systems.  The clip was withdrawn and Cook catheter was removed from the peritoneal  cavity.  The cystic duct is triply clipped and divided.  The cystic artery  is dissected out, doubly clipped, and divided.  The gallbladder was then  excised from the gallbladder bed using the hook electrocautery for  hemostasis.  The gallbladder was completely excised and placed into an  endocatch bag.  It is withdrawn through the umbilical port without  difficulty.  0 Vicryl purse-string suture was tied securely.  The right  upper quadrant was irrigated with warm saline which is evacuated.  Very good  hemostasis was noted.  The ports were removed under direct vision.  Pneumoperitoneum was released.  The  port sites were anesthetized with local  anesthetic.  All wounds were closed with interrupted 4-0 Vicryl subcuticular  sutures.  The wounds were washed and dried and Steri-Strips were applied.  Sterile dressings were applied.  The patient was awakened from anesthesia  and brought to the recovery room in stable condition.  The patient tolerated  the procedure well.                                               [name], M.D.    TMG/MEDQ  D:  08/13/2003  T:  08/13/2003  Job:  [id number]   cc:   [name], M.D.  [address]  [place]  [place] [address]  Fax: [phone]

## 2010-07-04 NOTE — H&P (Signed)
[name], [name]                       ACCOUNT NO.:  [id number]   MEDICAL RECORD NO.:  [id number]                   PATIENT TYPE:  INP   LOCATION:  5707                                 FACILITY:  [facility]   PHYSICIAN:  Leighton Roach McDiarmid, M.D.             DATE OF BIRTH:  Nov 13, 1965   DATE OF ADMISSION:  08/10/2003  DATE OF DISCHARGE:                                HISTORY & PHYSICAL   CHIEF COMPLAINT:  Vomiting.   HISTORY OF PRESENT ILLNESS:  This is a 45 year old white male with very  functional and independent cerebral palsy who had nausea and vomiting on  Tuesday night.  He got better on Wednesday but these symptoms returned  Thursday night.  He vomited x5 and was up all night.  Denies seeing any  blood or coffee-grounds.  This was associated with a 1/10 upper abdominal  pain.  It does not seem to be related to food.  Nothing seems to make him  any better or worse but just kind of comes and goes the past few days since  Tuesday.  He has constipation that he takes Mylanta for.  His last bowel  movement was a few days ago.  He did have one episode of diarrhea on  Tuesday.  Denies seeing any blood in his stools but does not really check.  He denies any recent travel, sick contacts, recent new medicines or  antibiotics.  Otherwise, he has no other complaints.   The patient has questionable fevers at night right before he vomits.  Denies  chest pain, shortness of breath, or cough.  Denies any rashes.  He is  jaundiced.  Denies any musculoskeletal pain.  Denies dysuria.  He does have  dark urine.  Denies hematuria.   PAST MEDICAL HISTORY:  1. Cerebral palsy status post 17 surgeries.  2. History of massive pleural effusion that was parapneumonic in March of     2005 treated with a chest tube, VATS, and thoracotomy and decortication     on the left.   MEDICATIONS:  Lioresal 10 mg p.o. q.i.d. for muscle spasms p.r.n.   ALLERGIES:  No known drug allergies..   SOCIAL HISTORY:   The patient lives alone, he is wheelchair-bound.  Denies  tobacco, alcohol, and drugs.  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  He has  strong family support.  His Mom and Dad are with him currently.   FAMILY HISTORY:  Noncontributory.   PHYSICAL EXAMINATION:  VITAL SIGNS:  Temperature is 98, heart rate is 89,  blood pressure is 129/83, respiratory rate is 18, oxygen saturation is 97%  on room air.  GENERAL:  This is a young white male in no acute distress.  He is sleepy  secondary to Phenergan.  He is obviously jaundiced with icterus.  He is  alert and oriented x3.  HEENT:  Pupils are equal, round, reactive to light.  Extraocular movements  are intact.  He has a wandering left eye.  He has obvious icterus.  Oropharynx is moist without erythema or exudates.  NECK:  No lymphadenopathy.  LUNGS:  Clear to auscultation bilaterally.  No wheezes or rhonchi.  Has good  air movement.  HEART:  Regular rate and rhythm without murmurs.  ABDOMEN:  Soft, nondistended with tenderness of palpation of his left upper  quadrant.  Right upper quadrant was not necessarily tender.  He had normal  bowel sounds.  He had no hepatosplenomegaly.  EXTREMITIES:  He moves all four extremities well.  He has 2+ peripheral  pulses throughout.  He has no pedal edema.  SKIN:  No rashes.  He has jaundice.  NEUROLOGICAL:  He has cranial nerves II-XII intact.  Again, he moves all 4  extremities well.   LABORATORY DATA:  1. CBC:  White blood cell count 6.5, hemoglobin 15.9, hematocrit 44.5,     platelet count 228.  Differential shows 82% neutrophils.  2. I-STAT-8 shows sodium of 138, potassium 3.8, chloride 100, CO2 27.1, BUN     8, creatinine 1, glucose 91, pH of 7.377, PCO2 of 46.1.  3. LFTs show SGOT of 301, SGPT of 309, alkaline phosphatase of 314, total     bilirubin of 15.4, indirect bilirubin of 7.7, direct bilirubin of 7.7,     albumin of 4.1, total protein of 6.7.  4. Amylase was 97, lipase was 21.  5.  Ammonia was 35.   X-RAYS:  Right upper quadrant ultrasound showed gallbladder filled with  stones and sludge.  Intrahepatic ducts were dilated.  His common bile duct  was within normal limits.  There was no visualization of the pancreas or  porta hepatis.  CT is recommended for further delineation.   ASSESSMENT AND PLAN:  This is a 45 year old white male with history of  cerebral palsy with acute nausea and vomiting secondary to cholelithiasis  and hepatitis.  I went ahead and called general surgery for consult.  Dr.  Gerrit Friends is on call and is to come see the patient.  He will need surgery.  His pain is controlled currently.  It is only rated 1/10.  We will keep him  NPO.  We will resuscitate him with normal saline and fluid.  We will follow  along with surgery if need be; however, he does not have any other major  medical problems besides acute issues of his liver and gallbladder.      [name], M.D.                            Etta Grandchild, M.D.    DV/MEDQ  D:  08/10/2003  T:  08/11/2003  Job:  [id number]

## 2010-07-04 NOTE — Consult Note (Signed)
NAMEJENKINS, RISDON                       ACCOUNT NO.:  [id number]   MEDICAL RECORD NO.:  [id number]                   PATIENT TYPE:  INP   LOCATION:  5707                                 FACILITY:  [facility]   PHYSICIAN:  Velora Heckler, M.D.                DATE OF BIRTH:  [birth year]   DATE OF CONSULTATION:  08/10/2003  DATE OF DISCHARGE:                                   CONSULTATION   REFERRING PHYSICIANS:  1. Dr. Emmit Alexanders.  2. Dr. Dora Sims Molpus.   CHIEF COMPLAINT:  Abdominal pain, cholelithiasis, jaundice.   HISTORY OF PRESENT ILLNESS:  The patient is a 45 year old white male from  [place] who presents to the emergency department with his mother with a  three-day history of abdominal pain radiating to the back associated with  nausea and vomiting.  The patient was seen and evaluated by the emergency  department staff.  Laboratory studies were obtained which showed abnormal  liver function tests with an elevated total bilirubin and elevated  transaminases and elevated alkaline phosphatase.  Abdominal ultrasound was  obtained which showed multiple gallstones with intrahepatic ductal  dilatation.  General surgery is consulted at this time.  Liver function  tests were markedly abnormal.  The patient was clinically jaundiced.   The patient notes dark-colored urine.  He denies any acholic stools.  He  denies fevers or chills.  He denies any previous such episodes of abdominal  or back pain.  He denies any previous episodes of jaundice.  He denies any  history of hepatitis or pancreatitis.   PAST MEDICAL HISTORY:  1. History of cerebral palsy.  2. Status post cervical fusion from C1 to C5 posteriorly.  3. Status post spontaneous pneumothorax with pulmonary stapling March 2005     by Dr. Dewayne Shorter from [facility].  4. Status post bladder suspension for urinary incontinence.  5. History of difficult airway.   MEDICATIONS:  Orphenadrine muscle relaxant p.r.n.   ALLERGIES:  None  known.   SOCIAL HISTORY:  The patient lives in [place].  He is accompanied by his  mother.  He lives alone.  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  He denies tobacco use.  He denies alcohol use.  He denies  illicit drug use.   REVIEW OF SYSTEMS:  Fifteen-system review without significant other  positives except as noted above.   FAMILY HISTORY:  Noncontributory.   PHYSICAL EXAMINATION:  GENERAL:  A 44 year old white male on a stretcher in  the emergency department.  VITAL SIGNS:  Temperature 98.5, pulse 80, respirations 16, blood pressure  110/60.  HEENT:  Normocephalic.  Gaze is disconjugate.  Sclerae are obviously  icteric.  Dentition is fair.  Mucous membranes are dry.  Voice quality is  normal.  NECK:  A significantly decreased range of motion.  There is no tenderness,  however, and no palpable masses.  LUNGS:  Clear bilaterally.  Thoracotomy incisions in the left lateral chest  are well healed.  CARDIAC:  Regular rate and rhythm without significant murmur.  Peripheral  pulses are full.  ABDOMEN:  Soft without distention.  There are no surgical wounds.  There are  bowel sounds present.  There is no tenderness.  There is no  hepatosplenomegaly.  There are no masses.  There is no sign of hernia.  EXTREMITIES:  Nontender without edema.  NEUROLOGIC:  The patient is alert and oriented to person, place, and time  without focal neurologic deficit.   LABORATORY STUDIES:  White count 6.5, hemoglobin 15.9, platelet count  228,000, total bilirubin 15.4, alkaline phosphatase 314, SGOT 301, SGPT 309,  amylase is normal at 97.   RADIOGRAPHIC STUDIES:  Ultrasound of the abdomen demonstrates a distended  gallbladder filled with stones and sludge.  There is intrahepatic biliary  dilatation.  There appears to be a normal common bile duct.   IMPRESSION:  1. Cholelithiasis.  2. Choledocholithiasis with ductal dilatation.  3. Obstructive jaundice.  4. Cerebral palsy.   5. History of difficult airway due to previous cervical spine fusion.   PLAN:  The patient will be admitted on the family practice teaching service.  I have recommended gastroenterology consultation immediately for possible  ERCP with stone extraction.  I have written for prophylactic antibiotics  with Ancef for the obstructive biliary tract.  I have discussed at length  with the patient and his mother the indications for cholecystectomy during  this hospitalization.  This can likely be performed by a laparoscopic  technique.  We discussed the procedure, the risks, and the benefits and the  timing of the surgery.  The patient and his mother understand and wish to  proceed.  We will await gastroenterology consultation.                                               Velora Heckler, M.D.    TMG/MEDQ  D:  08/10/2003  T:  08/11/2003  Job:  [id number]   cc:   Emmit Alexanders, M.D.  Fax: [phone]   Leighton Roach McDiarmid, M.D.  Fax: [phone]   Althea Grimmer. Luther Parody, M.D.  [address]  [place]  [place] [address]  Fax: [phone]

## 2010-07-04 NOTE — H&P (Signed)
[name], [name]                       ACCOUNT NO.:  [id number]   MEDICAL RECORD NO.:  [id number]                   PATIENT TYPE:  INP   LOCATION:  3313                                 FACILITY:  [facility]   PHYSICIAN:  Pearlean Brownie, M.D.            DATE OF BIRTH:  January 06, 1966   DATE OF ADMISSION:  05/03/2003  DATE OF DISCHARGE:                                HISTORY & PHYSICAL   HISTORY OF PRESENT ILLNESS:  This is a 45 year old white male with cerebral  palsy who presents with progressive shortness of breath for the past 3  weeks.  The patient has been previously treated for pneumonia for the past 2  months with 2 courses of antibiotics.  However, because of the shortness of  breath the patient consulted the [facility] and a chest CT done  showed massive left pleural effusion with mediastinal shift.   REVIEW OF SYSTEMS:  Positive nonproductive cough, left-sided back pain.  No  chest pains, weight loss, anorexia, nausea and vomiting.   PAST MEDICAL HISTORY:  1. Cerebral palsy status post 17 surgeries for cerebral palsy.  2. Cervical fusion in February 2004.   MEDICATIONS:  None at the present.   ALLERGIES:  No known drug allergies.   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  Is wheelchair-bound.  No  history of alcohol or tobacco use or drug use.  The patient finished second  year of college.   PHYSICAL EXAMINATION:  VITAL SIGNS:  On admission blood pressure 131/81,  pulse rate of 133, respiratory rate of 24.  O2 saturation of 95% on room  air.  GENERAL:  Awake, alert, pleasant young man with slow speech, not in  distress.  HEENT:  Pupils equally round and reactive to light.  Tympanic membranes with  normal landmarks.  No posterior pharyngeal congestion.  NECK:  No thyromegaly, no cervical lymphadenopathy.  LUNGS:  Decreased breath sounds on the left lung fields, decreased  vocal and  tactile fremitus on the left lung fields.  No crackles or wheezes.  CARDIOVASCULAR:  A dynamic precordium, tachycardic.  ABDOMEN:  Positive bowel sounds, no masses or tenderness.  EXTREMITIES:  Positive flexion contractures of both wrists.  No cyanosis.   LABORATORY DATA:  On admission, CT of the chest showed massive pleural  effusion of the left with mediastinal shift.  Hemoglobin 14.7, hematocrit  43.7, WBC count of 13.5, platelets of 411.  Sodium of 136, potassium 4.3,  chloride 97, CO2 28, BUN 5, creatinine 1, glucose 97.  PT of 16.1, INR of  1.4.  AST 11, ALT 11, alkaline phosphatase 73, total bilirubin 1.5   ASSESSMENT AND PLAN:  A 45 year old white male with massive left pleural  effusion, probably parapneumonic.   PROBLEM:  1. Pulmonary:  Will consult CVTS for chest tube insertion.  Will send     pleural fluids for  gram stain and cultures, cytology, cell counts,     protein, LDH and glucose.  Will maintain oxygen at 3 liters per minute to     keep O2 saturations more than 90%.  At the present, we will hold off on     antibiotics since the patient does not have a history of fever.  2. Pain management.  Will give morphine IV for pain.  3. Fluids, electrolytes.  NPO temporarily.  Start IV of normal saline     solution of 160 mL per hour.      Micheal Sabal, MD                         Pearlean Brownie, M.D.    MC/MEDQ  D:  05/08/2003  T:  05/09/2003  Job:  [id number]

## 2010-07-04 NOTE — H&P (Signed)
[name], [name]                       ACCOUNT NO.:  [id number]   MEDICAL RECORD NO.:  [id number]                   PATIENT TYPE:  INP   LOCATION:  5715                                 FACILITY:  [facility]   PHYSICIAN:  Althea Grimmer. Luther Parody, M.D.            DATE OF BIRTH:  1965/07/04   DATE OF ADMISSION:  10/18/2003  DATE OF DISCHARGE:                                HISTORY & PHYSICAL   [name] is a 45 year old wheelchair bound cerebral palsy patient who I  saw in the hospital in June with choledocholithiasis.  He underwent  sphincterotomy with removal of dozens of small stones and subsequent  cholecystectomy.  He had an unremarkable postoperative course.  Earlier this  week, Dr. Gerrit Friends sent him back to my office because of an abnormal  bilirubin.  Other liver function tests were all normal.  His bilirubin was  predominantly unconjugated, thus I suspected underlying Gilbert's syndrome.  However, the evening after that office visit, the patient had his typical  back pain and nausea which, in the past, recently was attributed to  choledocholithiasis.  He presented to the emergency room jaundiced with a  bilirubin of approximately 8 and alkaline phosphatase which was normal  earlier in the week is now 280, SGOT 158, SGPT 172.  Electrolytes were  normal.  Urinalysis was negative.  Lipase was not done in the emergency  room.  He describes his back discomfort as a 1 out of 10 and his epigastric  discomfort as a 2 to 3 out of 10 at this time.  He has had some intermittent  retching, he may have had a low grade temperature but essentially none  currently.  He has had no chills.   PAST MEDICAL HISTORY:  Earlier this year, he had pneumonia with a left  empyema requiring thoracotomy and decortication by Dr. Edwyna Shell.  He has had  neck surgery and is a difficult intubation.  He has problems opening his  mouth.  He has also had multiple orthopedic procedures for cerebral palsy.   CURRENT  MEDICATIONS:  Infrequent Lioresal for muscle spasms.   ALLERGIES:  None reported.   FAMILY HISTORY:  Noncontributory.   SOCIAL HISTORY:  He does not smoke or drink.  He is a Equities trader for the disabled.   REVIEW OF SYMPTOMS:  As above and in recent note.   PHYSICAL EXAMINATION:  GENERAL:  He is in no acute distress.  VITAL SIGNS:  Afebrile, blood pressure 123/76, pulse 83 and regular.  HEENT:  Eyes are minimally icteric.  Oropharynx unremarkable.  NECK:  Limited range of motion.  CHEST:  Clear.  HEART:  Regular rate and rhythm.  ABDOMEN:  Soft with positive bowel sounds and minimal epigastric tenderness,  there is no rebound or hernia.  RECTAL:  Exam not performed.  EXTREMITIES:  Spastic without edema.   IMPRESSION:  Patient probably has residual choledocholithiasis.   PLAN:  Admit to the hospital, hydrate with an IV, place him on  Ciprofloxacin, and schedule ERCP for re-evaluation of his common bile duct  early tomorrow morning.                                                Althea Grimmer. Luther Parody, M.D.    PJS/MEDQ  D:  10/18/2003  T:  10/18/2003  Job:  [id number]   cc:   Velora Heckler, M.D.  [address]  [place]  [place] [address]  Fax: [phone]   Leighton Roach McDiarmid, M.D.  Fax: [phone]

## 2010-07-04 NOTE — Discharge Summary (Signed)
NAMESIMCHA, SPEIR NO.:  [id number]   MEDICAL RECORD NO.:  [id number]          PATIENT TYPE:  INP   LOCATION:  5715                         FACILITY:  [facility]   PHYSICIAN:  [name], M.D.DATE OF BIRTH:  09-Jun-1965   DATE OF ADMISSION:  10/18/2003  DATE OF DISCHARGE:  10/24/2003                                 DISCHARGE SUMMARY   DISCHARGE DIAGNOSES:  1.  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  2.  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  3.  Recent pneumonia with left empyema requiring thoracotomy.  4.  Cerebral palsy with multiple orthopedic procedures.  5.  Status post cervical disc surgery.   HISTORY OF PRESENT ILLNESS:  [name] is a 45 year old wheelchair-bound  cerebral palsy patient who in June had choledocholithiasis and underwent  sphincterotomy with removal of dozen of small stones and subsequent  successful cholecystectomy.  He had an unremarkable postoperative course.  The week of admission, he had an abnormal bilirubin which increased and was  associated with back pain and nausea which in the past was attributed to  choledocholithiasis.   He presented to the emergency room with a bilirubin of approximately 8 and  an alkaline phosphate of 280 and transaminase elevation.  On physical exam,  he was afebrile with icteric sclerae and skin.  Abdomen was soft with  minimal epigastric tenderness.  For the remainder of his admission, please  see the admission note.   HOSPITAL COURSE:  He was placed on pain medications and Unasyn.  ERCP was  performed the day after admission. On ERCP, he had appeared to have stenosed  the sphincterotomy which could not be cannulated.  Assistance was obtained  from Dr. Petra Kuba.  After further attempts at cannulation, there  apparently was an iatrogenic microperforation with dye extravasating in the  retroperitoneum.  The procedure was terminated.  The patient  was treated  over the subsequent several days with antibiotics and kept NPO and hydrated.  He never developed significant fever, white count, or worsening abdominal  pain.   It was elected after discussion with Dr. Marilynne Drivers at [facility] to discharge him from our hospital for readmission at [facility] for subsequent attempt at ERCP with sphincterotomy there.  The patient and his family were in agreement.   LABORATORY DATA:  Hemoglobin 13.9, white blood count 6.7.  Normal  electrolytes.  Albumin 3.1.  Alkaline phosphate 321.  ALT 99.  AST 50.  Total bilirubin 1.8.   CONDITION ON DISCHARGE:  Improved and stable.   FOLLOW UP:  Return to my office as needed.  The patient will be readmitted  at [facility] following discharge from our hospital.       PJS/MEDQ  D:  11/27/2003  T:  11/27/2003  Job:  [id number]

## 2010-07-04 NOTE — Op Note (Signed)
NAMELUCUS, [name]                       ACCOUNT NO.:  [id number]   MEDICAL RECORD NO.:  [id number]                   PATIENT TYPE:  INP   LOCATION:  5715                                 FACILITY:  [facility]   PHYSICIAN:  Althea Grimmer. Luther Parody, M.D.            DATE OF BIRTH:  February 07, 1966   DATE OF PROCEDURE:  DATE OF DISCHARGE:                                 OPERATIVE REPORT   DATE OF PROCEDURE:  October 19, 2003.   PROCEDURE:  Attempted ERCP.   INDICATIONS:  Obstructive jaundice in a 45 year old male with cerebral  palsy, who had cholelithiasis and choledocholithiasis in June.  He is status  post ERCP with sphincterotomy and removal of dozens of common bile duct  stones along with laparoscopic cholecystectomy.  He presented with acute  onset of jaundice and back pain.   PREPARATION:  He is NPO since midnight and has been receiving ciprofloxacin  b.i.d. IV since admission.   PRE-PROCEDURE SEDATION:  Throat was anesthetized with Cetacaine spray x2,  and during the procedure he received 140 mg of Demerol, 11 mg of Versed, and  0.5 mg of Glucagon IV.   POSITION:  On the left side.   PROCEDURE:  The Olympus video duodenoscope was inserted via the mouth and  advanced with mild difficulty through the upper esophageal sphincter.  Intubation was then carried out to the stomach.  The pylorus was visualized,  and the scope passed easily.  The duodenoscope was then withdrawn to  visualize the ampulla, which appeared to be scarred from prior  sphincterotomy.  Despite probing extensively with the Tritome catheter and  subsequently with tapered-tip catheter, the opening to the bile duct could  not be identified.  Assistance was requested of Dr. Ewing Schlein, who thought an  opening was found at the top of the sphincterotomy scar.  With pressure, the  catheter did enter a cavity, which proved to be retroperitoneum after dye  injection.  This complication note is dictated separately by Dr. Ewing Schlein.   At  the recognition of complication, the procedure was terminated and patient  taken immediately to CT Scan for a CT scan, which demonstrated dye in the  peripancreatic retroperitoneum.  There was no large amount of free  air, and patient appeared to remain stable.  Consultation was immediately  obtained with Dr. Gerrit Friends, who advised bowel rest, antibiotics, and  observation, and the case was also discussed with Dr. Orvan Falconer of ID, who  advised changing his antibiotics to Unasyn.  This was accomplished.  Please  see the orders.                                               Althea Grimmer. Luther Parody, M.D.    PJS/MEDQ  D:  10/19/2003  T:  10/21/2003  Job:  [id number]  cc:   Velora Heckler, M.D.  [address]  [place]  [place] [address]  Fax: [phone]   Leighton Roach McDiarmid, M.D.  Fax: [phone]

## 2010-09-01 ENCOUNTER — Encounter: Payer: Self-pay | Admitting: Family Medicine

## 2010-09-01 ENCOUNTER — Ambulatory Visit (INDEPENDENT_AMBULATORY_CARE_PROVIDER_SITE_OTHER): Payer: Medicare Other | Admitting: Family Medicine

## 2010-09-01 VITALS — BP 129/90 | HR 90 | Temp 98.3°F | Wt 207.0 lb

## 2010-09-01 DIAGNOSIS — R2240 Localized swelling, mass and lump, unspecified lower limb: Secondary | ICD-10-CM

## 2010-09-01 DIAGNOSIS — IMO0001 Reserved for inherently not codable concepts without codable children: Secondary | ICD-10-CM

## 2010-09-01 DIAGNOSIS — R229 Localized swelling, mass and lump, unspecified: Secondary | ICD-10-CM

## 2010-09-01 DIAGNOSIS — R609 Edema, unspecified: Secondary | ICD-10-CM

## 2010-09-01 DIAGNOSIS — I10 Essential (primary) hypertension: Secondary | ICD-10-CM

## 2010-09-01 DIAGNOSIS — R600 Localized edema: Secondary | ICD-10-CM

## 2010-09-01 DIAGNOSIS — R569 Unspecified convulsions: Secondary | ICD-10-CM

## 2010-09-01 LAB — COMPREHENSIVE METABOLIC PANEL
ALT: 15 U/L (ref 0–53)
AST: 23 U/L (ref 0–37)
Albumin: 4.8 g/dL (ref 3.5–5.2)
Alkaline Phosphatase: 98 U/L (ref 39–117)
Glucose, Bld: 78 mg/dL (ref 70–99)
Potassium: 3.8 mEq/L (ref 3.5–5.3)
Sodium: 139 mEq/L (ref 135–145)
Total Bilirubin: 3.5 mg/dL — ABNORMAL HIGH (ref 0.3–1.2)
Total Protein: 6.8 g/dL (ref 6.0–8.3)

## 2010-09-01 LAB — CBC
MCH: 29.4 pg (ref 26.0–34.0)
MCHC: 35.9 g/dL (ref 30.0–36.0)
MCV: 82.1 fL (ref 78.0–100.0)
Platelets: 220 10*3/uL (ref 150–400)
RBC: 5.13 MIL/uL (ref 4.22–5.81)

## 2010-09-01 NOTE — Progress Notes (Signed)
  Subjective:    Patient ID: [name], male    DOB: [birth year], 45 y.o.   MRN: [id number]  HPI Staring spells: this has been a chronic issue for the pt that has ongoing for years. Pt states that episodes have acutely worsened over last 3-4 months. Pt is noted to have episodes of staring off where it takes vigorous shaking to get pt's attention. Pt states that he somewhat aware during these episodes, though pt feels like he's somewhat locked in/dreaming. Pt denies any LOC, bowel/bladder incontinence assd with episodes. No recent infections. No hemiparesis, heminopsia. No confusion after episodes. Pt states that he had an EEG done in [place] approx 10 years that was normal per pt. Currently does not see a neurologist.   LE edema: Pt reports longstanging hx/o LE edema that has acutely worsened. Was previoulsy placed on HCTZ for HTN/fluid overload. Pt stopped taking 2/2 frequent urination in the setting of wheelchair bound CP. States that LE edema seems more prominent on R leg. No pain or redness in area. No orthopnea, DOE. Is currently minimally-moderately mobile. No hx/o DVT in the past. Non smoker.    Review of Systems See HPI, otherwise 12 point ROS negative.  Objective:   Physical Exam Gen: in wheelchair, NAD  HEENT: EOMI, PERRL, funduscopic exam WNL, no nuchal rigidity CV: RRR, no rubs, gallops, murmurs PULM: CTAB, no wheezes, rales, rhocii ABD: obese, non tender, +bowel sounds EXT: 2-3+ pitting edema bilaterally, calves symmetric, mild R popliteal tenderness.    Assessment & Plan:  Staring spells- Will refer to neurology for further workup (?EEG though I think this is less likely needed). No acute signs of focal neurological deficits on exam today which are reassuring. Will also check CBC and CMET to rule out metabolic source of staring spells.  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 WIll check LE dopplers to rule out DVT. Pt  instructed to use HCTZ whenever tolerable for BP management. Pt agreeable to plan.  May consider ECHO to rule out cardiac source if this significantly worsens over time.

## 2010-09-01 NOTE — Assessment & Plan Note (Signed)
Will refer to neurology for further workup (?EEG though I think this is less likely needed). No acute signs of focal neurological deficits on exam today which are reassuring. Will also check CBC and CMET to rule out metabolic source of staring spells.

## 2010-09-01 NOTE — Patient Instructions (Signed)
It was good to meet you today I am referring you to the neurologist for your staring spells I am also checking some baseline labs For your leg swelling. I think that it is likely from chronic lack of ambulating (getting up and moving).  I am getting an ultrasound of your legs to make sure that there is no blood clot causing your leg swelling  Use the HCTZ when you can to help with your fluid.  Follow up with your regular doctor about any other issues that you may have.  Call with any other questions, God Bless, Doree Albee MD

## 2010-09-01 NOTE — Assessment & Plan Note (Addendum)
Likely chronic dependent LE edema in setting of poor ambulation from baseline CP. WIll check LE dopplers to rule out DVT. Pt instructed to use HCTZ whenever tolerable for BP management. Pt agreeable to plan.  May consider ECHO to rule out cardiac source if this significantly worsens over time.

## 2010-09-02 ENCOUNTER — Ambulatory Visit (HOSPITAL_COMMUNITY)
Admission: RE | Admit: 2010-09-02 | Discharge: 2010-09-02 | Disposition: A | Discharge status: Home / Self Care | Payer: Medicare Other | Source: Ambulatory Visit | Attending: Family Medicine | Admitting: Family Medicine

## 2010-09-02 ENCOUNTER — Encounter: Payer: Self-pay | Admitting: Family Medicine

## 2010-09-02 DIAGNOSIS — M7989 Other specified soft tissue disorders: Secondary | ICD-10-CM

## 2010-09-24 ENCOUNTER — Telehealth: Payer: Self-pay | Admitting: *Deleted

## 2010-09-24 NOTE — Telephone Encounter (Signed)
Called pt and left message on answering machine: APPT WITH DR.WILLIS ([facility] 10-05-08 AT 9 AM. PH # [phone]. Also mailed appt info to pt. Lorenda Hatchet, Renato Battles

## 2010-10-08 ENCOUNTER — Encounter: Payer: Self-pay | Admitting: Family Medicine

## 2010-12-10 ENCOUNTER — Ambulatory Visit (INDEPENDENT_AMBULATORY_CARE_PROVIDER_SITE_OTHER): Payer: Medicare Other | Admitting: Family Medicine

## 2010-12-10 ENCOUNTER — Ambulatory Visit
Admission: RE | Admit: 2010-12-10 | Discharge: 2010-12-10 | Disposition: A | Discharge status: Home / Self Care | Payer: Medicare Other | Source: Ambulatory Visit | Attending: Family Medicine | Admitting: Family Medicine

## 2010-12-10 ENCOUNTER — Encounter: Payer: Self-pay | Admitting: Family Medicine

## 2010-12-10 VITALS — BP 124/84 | HR 98 | Temp 98.1°F | Wt 206.0 lb

## 2010-12-10 DIAGNOSIS — R51 Headache: Secondary | ICD-10-CM

## 2010-12-10 DIAGNOSIS — J45901 Unspecified asthma with (acute) exacerbation: Secondary | ICD-10-CM

## 2010-12-10 DIAGNOSIS — R059 Cough, unspecified: Secondary | ICD-10-CM

## 2010-12-10 DIAGNOSIS — R05 Cough: Secondary | ICD-10-CM

## 2010-12-10 DIAGNOSIS — J4520 Mild intermittent asthma, uncomplicated: Secondary | ICD-10-CM | POA: Insufficient documentation

## 2010-12-10 DIAGNOSIS — G43909 Migraine, unspecified, not intractable, without status migrainosus: Secondary | ICD-10-CM

## 2010-12-10 MED ORDER — PREDNISONE 50 MG PO TABS: ORAL_TABLET | ORAL | Status: DC

## 2010-12-10 MED ORDER — KETOROLAC TROMETHAMINE 60 MG/2ML IJ SOLN
60.0000 mg | Freq: Once | INTRAMUSCULAR | Status: AC
  Administered 2010-12-10: 60 mg via INTRAMUSCULAR

## 2010-12-10 MED ORDER — AZITHROMYCIN 250 MG PO TABS: ORAL_TABLET | ORAL | Status: AC

## 2010-12-10 NOTE — Patient Instructions (Signed)
It was good to see you today  I am treating you for an asthma exacerbation with some concern for pneumonia  I would also like to get a chest xray Follow up with your new PCP, Dr. Lula Olszewski in 1 week, Call with any questions, God Bless,  Doree Albee MD

## 2010-12-10 NOTE — Progress Notes (Signed)
  Subjective:    Patient ID: [name], male    DOB: 1965-08-30, 45 y.o.   MRN: [id number]  HPI Pt is here for an acute visit for cough and increased WOB. Pt with baseline hx/o CP as well as intermittent asthma. Pt states that he has noticed increased WOB over the last 3-4 days. With persistent cough. Cough has been productive of clearish sputum. Pt has been using mucinex that has helped with this. Pt also with intermittent use of albuterol. This has also helped. No fevers or chills. Pt also has incentive spirometry at home. Pt states that he has been using this on a regular basis to help with lung compliance.  Pt also reports acute worsening in baseline headache since this cough has began. Patient is requesting shot of Toradol for this as this helped tremendously in the past.   Review of Systems See HPI    Objective:   Physical Exam Gen: in wheelchair, NAD HEENT: NCAT, EOMI, TMs clear bilaterally  CV: RRR PULM: Diffuse expiratory wheezes, decreased lung sounds in L lung base, no increased WOB, + cough with deep inspiration.    Assessment & Plan:

## 2010-12-10 NOTE — Assessment & Plan Note (Signed)
Toradol 60 IM x1. Discussed red flags for return.

## 2010-12-10 NOTE — Assessment & Plan Note (Signed)
This is likely multifactorial with concern for asthma exacerbation vs. Atypical process.  Will treat with prednisone and azithromycin. Will also obtain CXR. Infectious red flags discussed. Pt instructed to follow up with PCP in 1 week for reassessment.

## 2010-12-11 ENCOUNTER — Telehealth: Payer: Self-pay | Admitting: Family Medicine

## 2010-12-11 NOTE — Telephone Encounter (Signed)
Called Pt's mom back at [phone], let her know that CXR does not show PNA.  Advised they follow up in 1 week as Dr. Alvester Morin suggested.

## 2010-12-11 NOTE — Telephone Encounter (Signed)
Will forward to Dr. Alvester Morin and PCP

## 2010-12-11 NOTE — Telephone Encounter (Signed)
Was supposed to hear about his chest xray and no one has called yet.  Can someone call to let him know?

## 2010-12-15 ENCOUNTER — Encounter: Payer: Self-pay | Admitting: Family Medicine

## 2010-12-17 ENCOUNTER — Ambulatory Visit (INDEPENDENT_AMBULATORY_CARE_PROVIDER_SITE_OTHER): Payer: Medicare Other | Admitting: Family Medicine

## 2010-12-17 ENCOUNTER — Encounter: Payer: Self-pay | Admitting: Family Medicine

## 2010-12-17 VITALS — BP 120/80 | HR 87 | Temp 97.8°F | Ht <= 58 in | Wt 206.0 lb

## 2010-12-17 DIAGNOSIS — J40 Bronchitis, not specified as acute or chronic: Secondary | ICD-10-CM

## 2010-12-17 DIAGNOSIS — R05 Cough: Secondary | ICD-10-CM

## 2010-12-17 DIAGNOSIS — J45909 Unspecified asthma, uncomplicated: Secondary | ICD-10-CM

## 2010-12-17 MED ORDER — ALBUTEROL SULFATE (2.5 MG/3ML) 0.083% IN NEBU
2.5000 mg | INHALATION_SOLUTION | Freq: Once | RESPIRATORY_TRACT | Status: AC
  Administered 2010-12-17: 2.5 mg via RESPIRATORY_TRACT

## 2010-12-17 MED ORDER — GUAIFENESIN ER 600 MG PO TB12: 600.0000 mg | ORAL_TABLET | Freq: Two times a day (BID) | ORAL | Status: DC

## 2010-12-17 MED ORDER — ALBUTEROL SULFATE 1.25 MG/3ML IN NEBU: 1.0000 | INHALATION_SOLUTION | Freq: Four times a day (QID) | RESPIRATORY_TRACT | Status: DC | PRN

## 2010-12-17 MED ORDER — KETOROLAC TROMETHAMINE 60 MG/2ML IM SOLN
60.0000 mg | Freq: Once | INTRAMUSCULAR | Status: AC
  Administered 2010-12-17: 60 mg via INTRAMUSCULAR

## 2010-12-17 NOTE — Progress Notes (Signed)
  Subjective:    Patient ID: [name], male    DOB: 12-18-1965, 45 y.o.   MRN: [id number]  HPI Patient is being seen today for followup visit for recent asthma exacerbation/bronchitis in the setting of cerebral palsy. At last clinical visit, patient was placed on prednisone as well as azithromycin at the time. A chest x-ray was performed which was negative for any acute focal opacities.  Today, patient states that he still had persistent cough since last clinical visit. Overall increased work of breathing has improved somewhat. Patient denies any fever, nausea, vomiting. Patient does report some put pleuritic chest pain is worse with coughing. Patient also with sputum production.  From a respiratory standpoint, patient is been compliant with using his incentive spirometer up to 3 times an hour. Patient has not been using his albuterol inhaler on scheduled basis. Patient states he's been using Mucinex with minimal relief in cough. Patient states his major issue is cough and sputum clearance.   Review of Systems See HPI     Objective:   Physical Exam Gen: in wheelchair, NAD  HEENT: NCAT, EOMI, TMs clear bilaterally  CV: RRR  PULM: Diffuse expiratory wheezes, decreased lung sounds in L lung base, no increased WOB, + cough with deep inspiration.    Assessment & Plan:

## 2010-12-17 NOTE — Assessment & Plan Note (Addendum)
I think this is likely secondary to a viral URI with some underlying respiratory risk factors including asthma and cerebral palsy. I think patient would benefit from chest physiotherapy as well as scheduled nebulized albuterol treatments as the saline component  of albuterol nebulizer will help with mucus clearance. Patient instructed to continue incentive spirometry use.Will give patient a shot of Toradol today in clinic to help with pleuritic chest pain. Overall pulse ox and work of breathing is reassuring. There is a lower threshold for reevaluation given patient with cerebral palsy and asthma. Will have patient come back in to clinic for repeat evaluation on Friday of this week. Patient may benefit from a repeat course of prednisone at that time. Patient may also benefit benefit from outpatient PFTs once this acute respiratory episode improves

## 2010-12-17 NOTE — Patient Instructions (Signed)
It was good to see today I'm sorry that you are still feeling bad For your breathing I would like to set you up on scheduled albuterol treatments every 4 hours. I would also like to have respiratory therapy come by and see you to set you up and chest physiotherapy Try to avoid cold weather as this may exacerbate her breathing Continue to use the  incentive spirometer Come back on Friday for followup visit Please call with any questions God Bless, Doree Albee MD

## 2010-12-19 ENCOUNTER — Encounter: Payer: Self-pay | Admitting: Family Medicine

## 2010-12-19 ENCOUNTER — Ambulatory Visit (INDEPENDENT_AMBULATORY_CARE_PROVIDER_SITE_OTHER): Payer: Medicare Other | Admitting: Family Medicine

## 2010-12-19 DIAGNOSIS — J45909 Unspecified asthma, uncomplicated: Secondary | ICD-10-CM

## 2010-12-19 DIAGNOSIS — Z23 Encounter for immunization: Secondary | ICD-10-CM

## 2010-12-19 MED ORDER — BREATHERITE COLL SPACER ADULT MISC: 1.0000 | Freq: Once | Status: DC

## 2010-12-19 NOTE — Patient Instructions (Signed)
You can continue to use the albuterol with a spacer as you need it. Please come back and see Dr. Lula Olszewski in two weeks to make sure that the cough is better.  I do not think you'll have to return your nebulizer. This will stay with you.

## 2010-12-19 NOTE — Progress Notes (Signed)
  Subjective:    Patient ID: [name], male    DOB: [birth year], 45 y.o.   MRN: [id number]  HPI Patient is here for followup of asthma exacerbation. He feels much better but he is still using albuterol about every 6 hours. He says that he uses it when his coughing gets bad and this seems to help. He denies any chest pain or shortness of breath at this time. He thinks he is now able to use his inhaler instead of the nebulizer.   Review of Systems Denies CP, SOB, HA, N/V/D, fever     Objective:   Physical Exam Vital signs reviewed General appearance - alert, well appearing, and in no distress and oriented to person, place, and time Heart - normal rate, regular rhythm, normal S1, S2, no murmurs, rubs, clicks or gallops Chest - clear to auscultation, no wheezes, rales or rhonchi, symmetric air entry, no tachypnea, retractions or cyanosis        Assessment & Plan:  Asthma Patient much improved. Advised patient to use inhaler instead of the nebulizer for now. Will write for spacer.

## 2010-12-19 NOTE — Assessment & Plan Note (Signed)
Patient much improved. Advised patient to use inhaler instead of the nebulizer for now. Will write for spacer.

## 2010-12-24 ENCOUNTER — Ambulatory Visit: Payer: Medicare Other | Admitting: Family Medicine

## 2011-01-29 ENCOUNTER — Emergency Department (HOSPITAL_COMMUNITY)
Admission: EM | Admit: 2011-01-29 | Discharge: 2011-01-29 | Disposition: A | Discharge status: Home / Self Care | Payer: Medicare Other | Attending: Emergency Medicine | Admitting: Emergency Medicine

## 2011-01-29 ENCOUNTER — Ambulatory Visit: Payer: Medicare Other | Admitting: Family Medicine

## 2011-01-29 ENCOUNTER — Emergency Department (HOSPITAL_COMMUNITY): Payer: Medicare Other

## 2011-01-29 ENCOUNTER — Encounter (HOSPITAL_COMMUNITY): Payer: Self-pay

## 2011-01-29 DIAGNOSIS — S93409A Sprain of unspecified ligament of unspecified ankle, initial encounter: Secondary | ICD-10-CM | POA: Insufficient documentation

## 2011-01-29 DIAGNOSIS — M25579 Pain in unspecified ankle and joints of unspecified foot: Secondary | ICD-10-CM | POA: Insufficient documentation

## 2011-01-29 DIAGNOSIS — M79609 Pain in unspecified limb: Secondary | ICD-10-CM | POA: Insufficient documentation

## 2011-01-29 DIAGNOSIS — X500XXA Overexertion from strenuous movement or load, initial encounter: Secondary | ICD-10-CM | POA: Insufficient documentation

## 2011-01-29 DIAGNOSIS — M25476 Effusion, unspecified foot: Secondary | ICD-10-CM | POA: Insufficient documentation

## 2011-01-29 DIAGNOSIS — M25473 Effusion, unspecified ankle: Secondary | ICD-10-CM | POA: Insufficient documentation

## 2011-01-29 DIAGNOSIS — R609 Edema, unspecified: Secondary | ICD-10-CM | POA: Insufficient documentation

## 2011-01-29 MED ORDER — IBUPROFEN 800 MG PO TABS: 800.0000 mg | ORAL_TABLET | Freq: Three times a day (TID) | ORAL | Status: AC | PRN

## 2011-01-29 MED ORDER — IBUPROFEN 800 MG PO TABS
800.0000 mg | ORAL_TABLET | Freq: Once | ORAL | Status: AC
  Administered 2011-01-29: 800 mg via ORAL
  Filled 2011-01-29: qty 1

## 2011-01-29 NOTE — ED Notes (Signed)
Pt. Reports getting  His lt. Foot caught under the footpad in his electric wheelchair,  It bent his foot forward.  He is having pain the back of his ankle into his foot.  He is able to place weight on the foot and keeping his shoe on decreases the pain.  He also reports that he has swelling to the lt. Foot but no deformity

## 2011-01-29 NOTE — ED Notes (Signed)
PA at bedside.

## 2011-01-29 NOTE — ED Provider Notes (Signed)
Medical screening examination/treatment/procedure(s) were performed by non-physician practitioner and as supervising physician I was immediately available for consultation/collaboration.  Analyah Mcconnon R. Peretz Thieme, MD 01/29/11 1551 

## 2011-01-29 NOTE — ED Provider Notes (Signed)
History     CSN: [id number] Arrival date & time: 01/29/2011  9:02 AM   First MD Initiated Contact with Patient 01/29/11 [phone]      Chief Complaint  Patient presents with  . Foot Pain    (Consider location/radiation/quality/duration/timing/severity/associated sxs/prior treatment) HPI Comments: Patient had his foot caught under a foot pad of his electric wheelchair 2 days ago and twisted ankle. He complains of left foot pain and left ankle pain. Pain is decreased when he has his shoe on. Patient bears weight on foot that causes pain. Patient does not typically ambulate. No other treatments prior.  Patient is a 45 y.o. male presenting with lower extremity pain. The history is provided by the patient and a relative.  Foot Pain This is a new problem. The current episode started in the past 7 days. The problem occurs constantly. Associated symptoms include joint swelling. Pertinent negatives include no arthralgias, headaches, neck pain, numbness or weakness. The symptoms are aggravated by standing. He has tried nothing for the symptoms.    No past medical history on file.  No past surgical history on file.  No family history on file.  History  Substance Use Topics  . Smoking status: Never Smoker   . Smokeless tobacco: Not on file  . Alcohol Use: No      Review of Systems  Constitutional: Negative for activity change.  HENT: Negative for neck pain.   Musculoskeletal: Positive for joint swelling. Negative for back pain and arthralgias.  Skin: Negative for wound.  Neurological: Negative for weakness, numbness and headaches.    Allergies  Review of patient's allergies indicates no known allergies.  Home Medications   Current Outpatient Rx  Name Route Sig Dispense Refill  . ACETAMINOPHEN ER 650 MG PO TBCR Oral Take 650 mg by mouth daily as needed.      . ALBUTEROL SULFATE 1.25 MG/3ML IN NEBU Nebulization Take 1 ampule by nebulization every 6 (six) hours as needed. For  wheezing     . ALBUTEROL SULFATE HFA 108 (90 BASE) MCG/ACT IN AERS Inhalation Inhale 2 puffs into the lungs every 4 (four) hours as needed.        BP 148/103  Pulse 82  Temp(Src) 97.3 F (36.3 C) (Oral)  Resp 16  SpO2 96%  Physical Exam  Nursing note and vitals reviewed. Constitutional: He is oriented to person, place, and time. He appears well-developed and well-nourished.  HENT:  Head: Normocephalic and atraumatic.  Eyes: Conjunctivae are normal. Right eye exhibits no discharge. Left eye exhibits no discharge.  Musculoskeletal: He exhibits edema and tenderness.       There is mild tenderness to palpation over lateral aspect of forefoot and the lateral malleolus.  Neurological: He is alert and oriented to person, place, and time.  Skin: Skin is warm and dry. No erythema.  Psychiatric: He has a normal mood and affect.    ED Course  Procedures (including critical care time)  Labs Reviewed - No data to display Dg Ankle Complete Left  01/29/2011  *RADIOLOGY REPORT*  Clinical Data: Foot pain, lateral ankle pain.  LEFT ANKLE COMPLETE - 3+ VIEW  Comparison: None.  Findings: No true AP view was obtained due to the patient's physical limitations.  I see no acute bony abnormality.  No visible fracture.  Soft tissues appear intact.  IMPRESSION: No visible acute bony abnormality.  Original Report Authenticated By: Cyndie Chime, M.D.     1. Ankle sprain    Patient seen and  examined. X-rays ordered and was negative for fracture. Patient will use shoe as splint as this improves his symptoms. Motrin prescribed for pain. Patient counseled on right paracolic asked to followup with his primary care doctor if no improvement in 7 days. Patient verbalizes understanding and agrees with plan.   MDM  Patient with foot/ankle injury. X-ray is negative. Conservative management indicated.        Eustace Moore [place], [place] 01/29/11 1039

## 2011-01-29 NOTE — ED Notes (Signed)
Patient transported to X-ray 

## 2011-02-02 ENCOUNTER — Other Ambulatory Visit: Payer: Self-pay | Admitting: Neurology

## 2011-02-02 DIAGNOSIS — R404 Transient alteration of awareness: Secondary | ICD-10-CM

## 2011-02-02 DIAGNOSIS — G809 Cerebral palsy, unspecified: Secondary | ICD-10-CM

## 2011-02-12 ENCOUNTER — Ambulatory Visit
Admission: RE | Admit: 2011-02-12 | Discharge: 2011-02-12 | Disposition: A | Discharge status: Home / Self Care | Payer: Medicare Other | Source: Ambulatory Visit | Attending: Neurology | Admitting: Neurology

## 2011-02-12 DIAGNOSIS — G809 Cerebral palsy, unspecified: Secondary | ICD-10-CM

## 2011-02-12 DIAGNOSIS — R404 Transient alteration of awareness: Secondary | ICD-10-CM

## 2011-03-30 ENCOUNTER — Ambulatory Visit (INDEPENDENT_AMBULATORY_CARE_PROVIDER_SITE_OTHER): Payer: Medicare Other | Admitting: Family Medicine

## 2011-03-30 ENCOUNTER — Encounter: Payer: Self-pay | Admitting: Family Medicine

## 2011-03-30 DIAGNOSIS — J45909 Unspecified asthma, uncomplicated: Secondary | ICD-10-CM

## 2011-03-30 NOTE — Patient Instructions (Signed)
It was good to see you.  I am glad you are doing so much better.  I want to have you do spirometry, this is a simple lung function test.  Please ask the front desk to schedule you in pharmacy clinic for spirometry.    I will talk with Dr. Raymondo Band about the results of those test, and may start an every day maintenance medication for asthma.

## 2011-03-30 NOTE — Progress Notes (Signed)
  Subjective:    Patient ID: [name], male    DOB: 09/10/1965, 46 y.o.   MRN: [id number]  HPI  [name] and his mom come in for follow up of his breathing.  He was very sick with a URI and wheezing in November.  He had not had a diagnosis of asthma before that.  He was prescribed albuterol MDI and nebulizer, which helped a lot with his symptoms.    He is feeling much better now, but states he still uses albuterol 2-4 x/week.  He has noticed he needs it when he encounters people smoking  (in the community, on busses, etc, no one smokes at home).  He says he also has noticed that cold weather makes him feel better, and hot humid air makes him feel worse. He is no longer coughing, no fevers, chills.   Review of Systems Pertinent items in HPI.     Objective:   Physical Exam BP 136/97  Pulse 74  Temp(Src) 97.7 F (36.5 C) (Oral)  Wt 210 lb 8 oz (95.482 kg) General appearance: alert, cooperative and in wheelchair, dysmorphic Cerebral palsy patient.  Eyes: conjunctivae/corneas clear. PERRL, EOM's intact. Fundi benign. Nose: Nares normal. Septum midline. Mucosa normal. No drainage or sinus tenderness. Throat: lips, mucosa, and tongue normal; teeth and gums normal Neck: no adenopathy, supple, symmetrical, trachea midline and thyroid not enlarged, symmetric, no tenderness/mass/nodules Lungs: clear to auscultation bilaterally Heart: regular rate and rhythm, S1, S2 normal, no murmur, click, rub or gallop Pulses: 2+ and symmetric       Assessment & Plan:

## 2011-03-30 NOTE — Assessment & Plan Note (Signed)
Discussed possibility of starting maintenance medication (QVAR) if frequent wheezing/using albuterol.  However, will send pt of spirometry to ensure this is an asthma- type pattern.  This may have been wheezing with a virus and not true asthma.  Continue albuterol as needed.

## 2011-04-03 ENCOUNTER — Ambulatory Visit (INDEPENDENT_AMBULATORY_CARE_PROVIDER_SITE_OTHER): Payer: Medicare Other | Admitting: Pharmacist

## 2011-04-03 ENCOUNTER — Encounter: Payer: Self-pay | Admitting: Pharmacist

## 2011-04-03 DIAGNOSIS — J45909 Unspecified asthma, uncomplicated: Secondary | ICD-10-CM

## 2011-04-03 MED ORDER — BECLOMETHASONE DIPROPIONATE 80 MCG/ACT IN AERS: 2.0000 | INHALATION_SPRAY | Freq: Two times a day (BID) | RESPIRATORY_TRACT | Status: DC

## 2011-04-03 MED ORDER — ALBUTEROL SULFATE HFA 108 (90 BASE) MCG/ACT IN AERS: 2.0000 | INHALATION_SPRAY | RESPIRATORY_TRACT | Status: DC | PRN

## 2011-04-03 NOTE — Assessment & Plan Note (Signed)
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  Patient has been experiencing shortness of breath when around triggers and taking Albuterol MDI & Nebulizer as needed. Added QVAR 2 puffs twice daily to treatment plan at this time and discussed dosing, technique for use, side effects.  We may consider using nebulized steroid (budesonide- pulmicort) if patient has hard time with spacer.   He has a nebulizer machine at home already.   Reviewed results of pulmonary function tests.  Pt verbalized understanding of results.  Written pt instructions and demonstration with spacer provided .  F/U Clinic visit with Dr. Lula Olszewski.   Total time in face to face counseling 40 minutes.  Patient seen with Benjaman Pott, PharmD

## 2011-04-03 NOTE — Progress Notes (Signed)
  Subjective:    Patient ID: [name], male    DOB: 1965-06-15, 46 y.o.   MRN: [id number]  HPI   Reviewed and agree with Dr. Macky Lower management   Review of Systems     Objective:   Physical Exam        Assessment & Plan:

## 2011-04-03 NOTE — Progress Notes (Signed)
  Subjective:    Patient ID: [name], male    DOB: 02/17/1966, 46 y.o.   MRN: [id number]  HPI 77 yoM presented to the clinic with his mother Jamesetta So) for PFTs in good spirits. Patient has a history of a lung resection, which has impacted his pulmonary function. He is using his albuterol MDI most days of the week, especially around people that smoke or smell like smoke. He also has albuterol nebulizer that he uses ~2x every 2 weeks when he knows he will be around a trigger. He has a spacer at home but has had issues using due to inability to use both hands to hold the contraption. His mother also reports that he has wheezes upon exertion. Discussed potential concerns of steroid use with patient and mother. Demonstrated proper technique for using a spacer with patient given patient's dexterity.   Review of Systems     Objective:   Physical Exam  See Documentation Flowsheet (discrete results - PFTs) for complete Spirometry results.   Patient provided good effort while attempting spirometry.        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  Patient has been experiencing shortness of breath when around triggers and taking Albuterol MDI & Nebulizer as needed. Added QVAR 2 puffs twice daily to treatment plan at this time and discussed dosing, technique for use, side effects.  We may consider using nebulized steroid (budesonide- pulmicort) if patient has hard time with spacer.   He has a nebulizer machine at home already.   Reviewed results of pulmonary function tests.  Pt verbalized understanding of results.  Written pt instructions and demonstration with spacer provided .  F/U Clinic visit with Dr. Lula Olszewski.   Total time in face to face counseling 40 minutes.  Patient seen with Benjaman Pott, PharmD

## 2011-04-03 NOTE — Patient Instructions (Signed)
It was nice to meet you!  Start taking QVAR Inhaler: 2 puffs twice daily. Rinse your mouth after using QVAR.  Continue to use your Ventolin albuterol inhaler as needed.  Follow-up with Dr. Lula Olszewski.

## 2011-04-11 IMAGING — CR DG CHEST 2V
2 series · 2 of 2 positions shown · non-contrast
Comparison: 11/21/2004

CLINICAL DATA: Cough.  Congestion.

CHEST - 2 VIEW

[w chest lat]
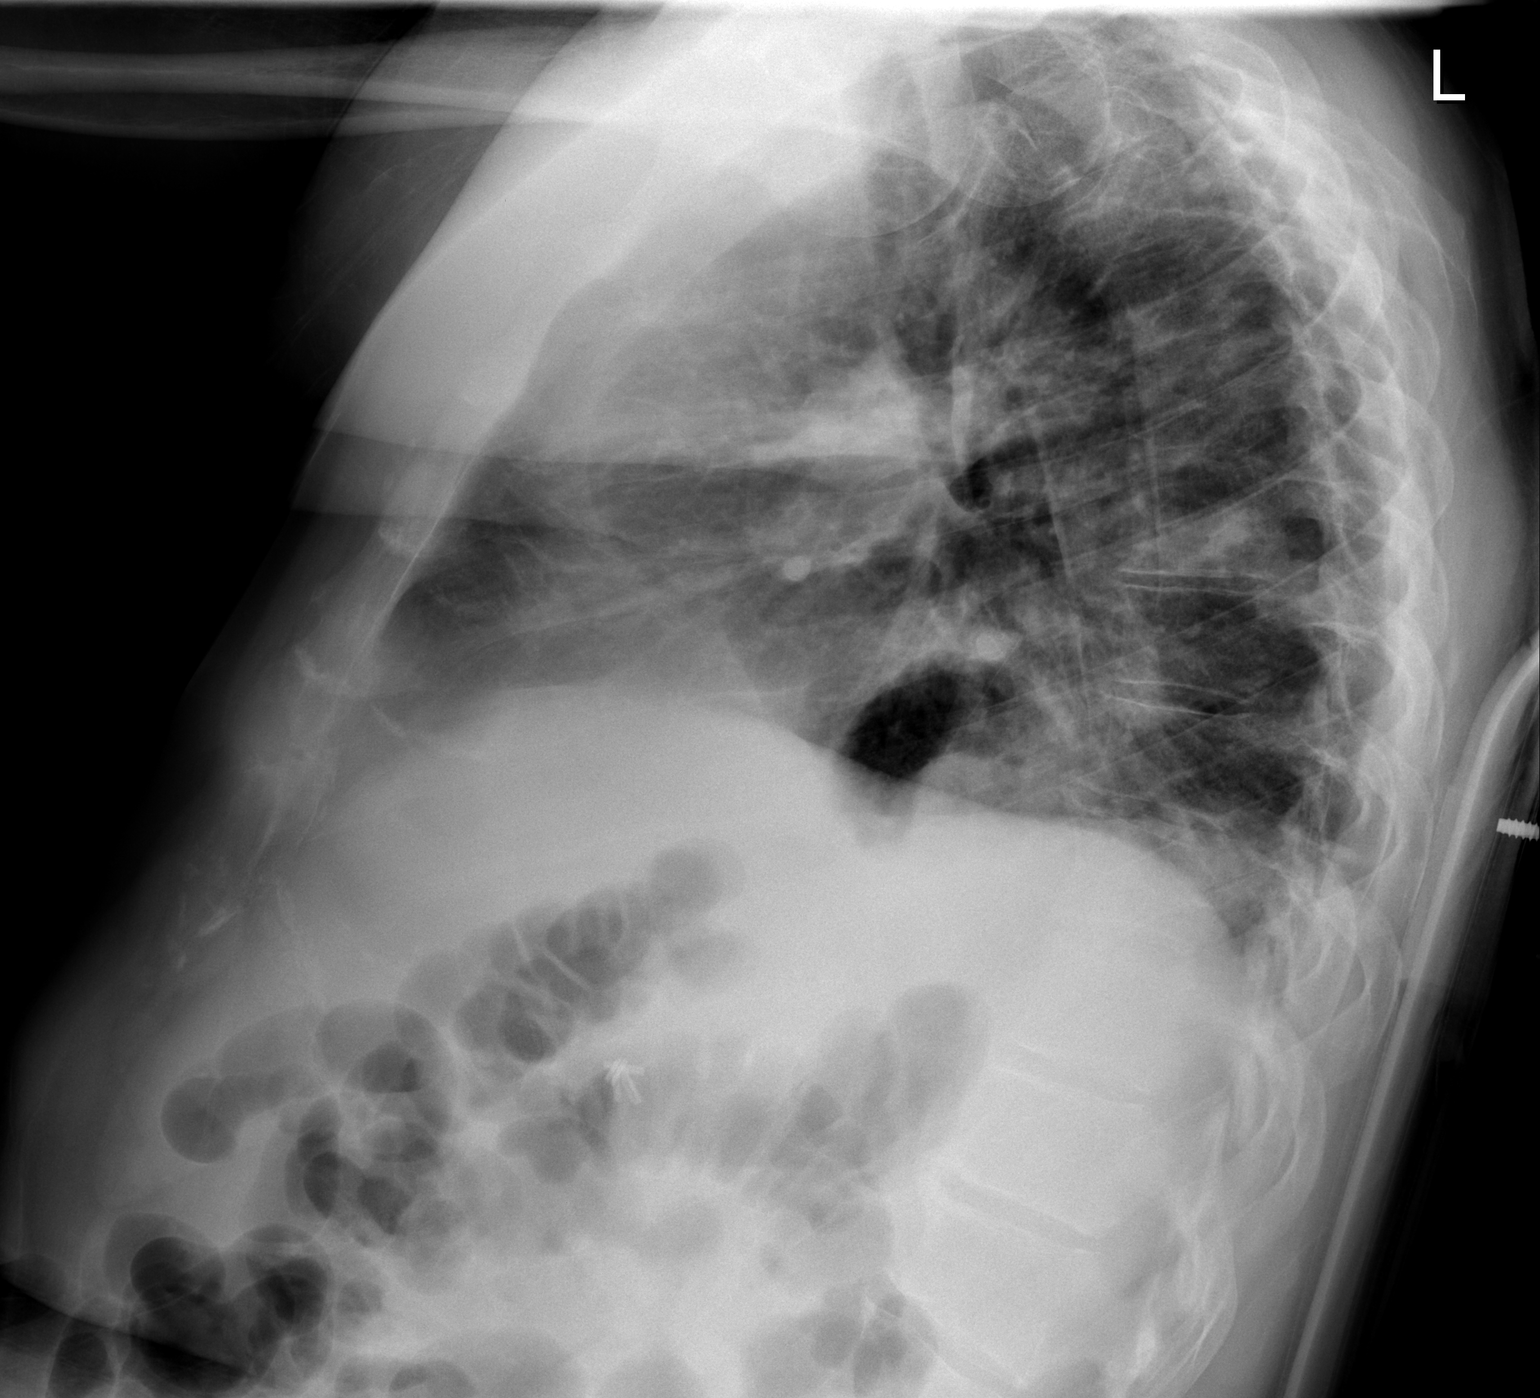

[view not recorded]
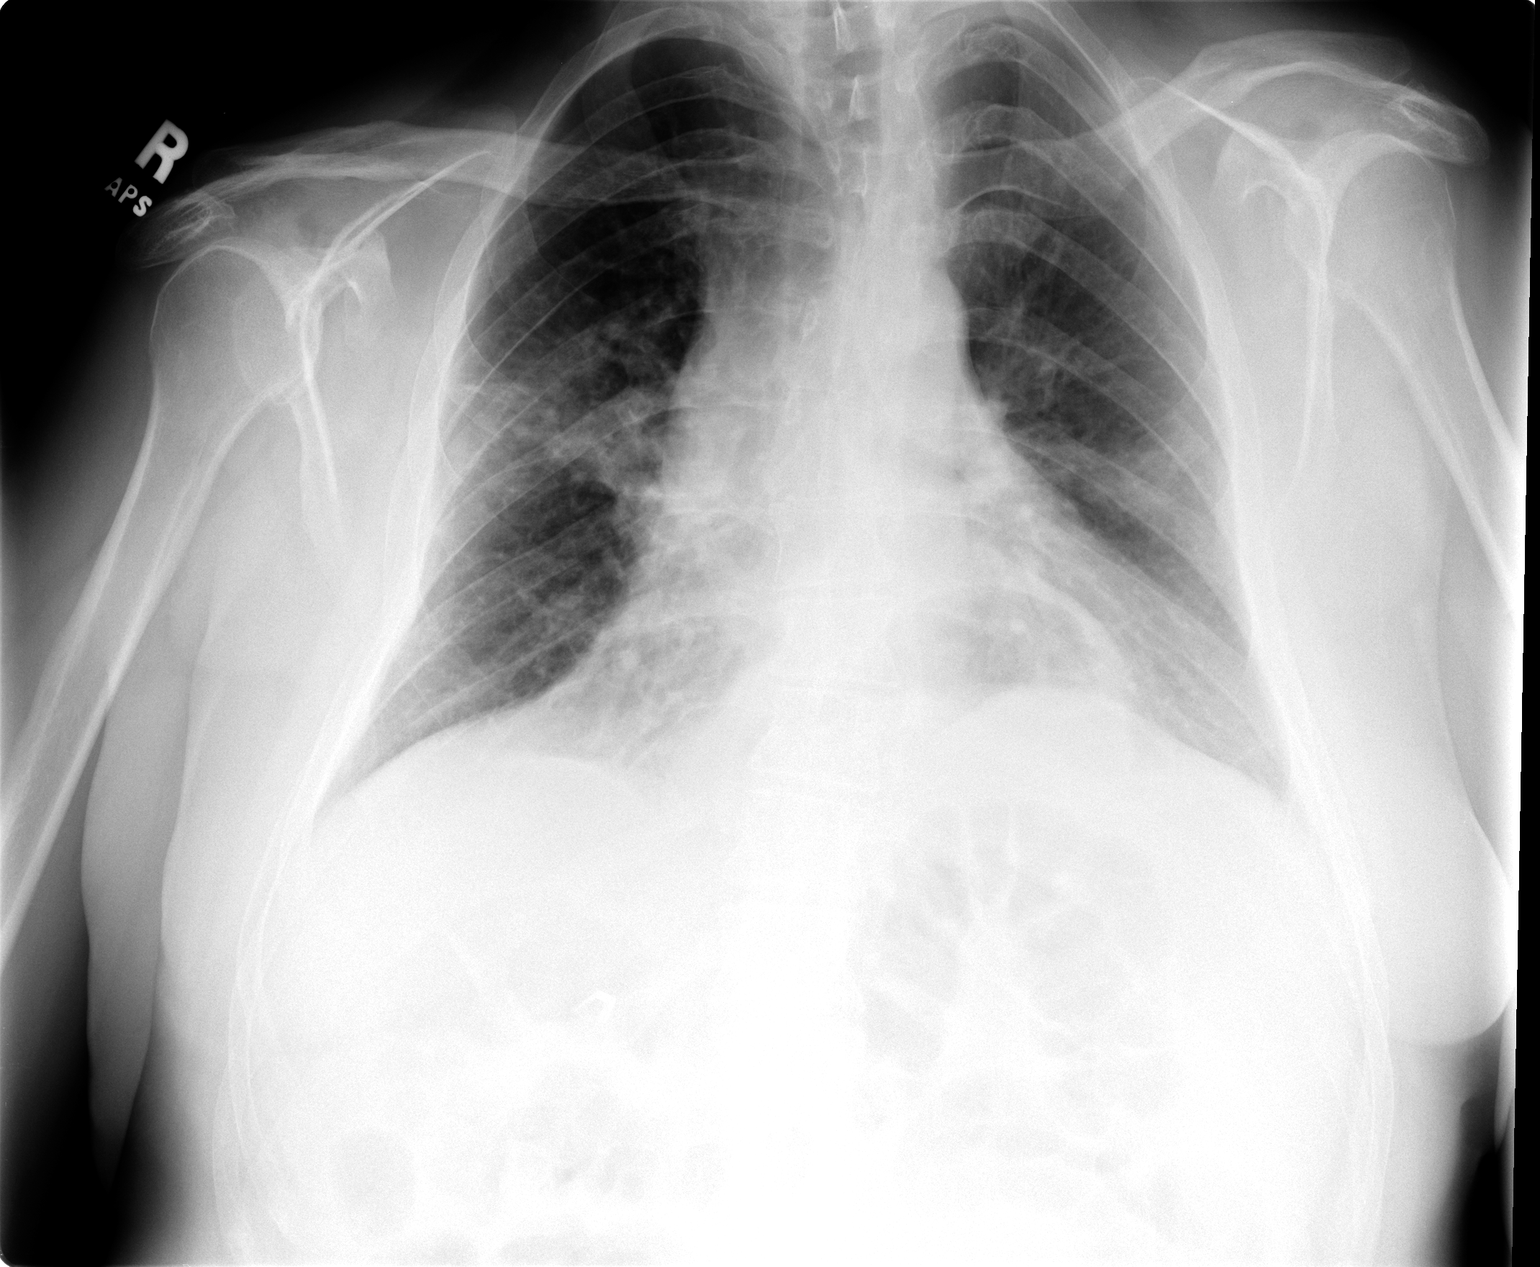

[2 of 2 positions shown; findings below may reference images not displayed]

FINDINGS: Airspace opacity in the right mid lung is present.
Similar but less striking airspace opacity is present
contralaterally in the left midlung.  The appearance is suspicious
for bilateral pneumonia.

Moderate sized hiatal hernia noted.  Underlying mild cardiomegaly
is present.
IMPRESSION: 1.  Bilateral midlung airspace opacity, right greater than left,
suspicious for multilobar pneumonia.  Follow-up radiography to
ensure clearance and exclude malignancy is recommended.
2. Moderate sized hiatal hernia.
3.  Mild cardiomegaly.

## 2011-09-03 ENCOUNTER — Other Ambulatory Visit: Payer: Self-pay | Admitting: Family Medicine

## 2011-11-11 ENCOUNTER — Telehealth: Payer: Self-pay | Admitting: *Deleted

## 2011-11-11 NOTE — Telephone Encounter (Signed)
Mother called yesterday inquiring about next needed pneumovax.  Patient had Pneumovax 2007 and 2009.   She was told in past that he needed pneumovax every 2 years. Consulted with Dr. Leveda Anna and advised mother that patient can get another pneumovax in about 10 years. Patient will be 46  years old in 22 years and Dr. Leveda Anna advises to receive one half way from now to that time. Then he will get one after age 26.

## 2012-05-20 ENCOUNTER — Ambulatory Visit: Payer: Medicare Other | Admitting: Family Medicine

## 2012-05-23 ENCOUNTER — Encounter: Payer: Self-pay | Admitting: Family Medicine

## 2012-05-23 ENCOUNTER — Ambulatory Visit (INDEPENDENT_AMBULATORY_CARE_PROVIDER_SITE_OTHER): Payer: Medicare Other | Admitting: Family Medicine

## 2012-05-23 VITALS — BP 124/78 | Ht 69.0 in | Wt 210.0 lb

## 2012-05-23 DIAGNOSIS — E559 Vitamin D deficiency, unspecified: Secondary | ICD-10-CM

## 2012-05-23 DIAGNOSIS — R200 Anesthesia of skin: Secondary | ICD-10-CM

## 2012-05-23 DIAGNOSIS — R209 Unspecified disturbances of skin sensation: Secondary | ICD-10-CM

## 2012-05-23 LAB — VITAMIN B12: Vitamin B-12: 454 pg/mL (ref 211–911)

## 2012-05-23 NOTE — Patient Instructions (Addendum)
Nice to meet you today. We are going to get a couple of labs, B12 and vitamin D. Please start taking a multivitamin. This sensory problem is most likely due to a vitamin deficiency or a compressed nerve. Your exam is reassuring in that you have normal strength and reflexes. We would like you to see your neurologist for potential EMG evaluation to determine if you have nerve compression any where along the course of the nerves for your right leg from your back into your leg. Please call your neurologist to see if they can set this up. If they are unable to do this, give [place] a call and we will refer you to a neurologist that arrange this evaluation.

## 2012-05-24 ENCOUNTER — Telehealth: Payer: Self-pay | Admitting: Family Medicine

## 2012-05-24 DIAGNOSIS — R202 Paresthesia of skin: Secondary | ICD-10-CM

## 2012-05-24 MED ORDER — CHOLECALCIFEROL 125 MCG (5000 UT) PO CAPS: 50000.0000 [IU] | ORAL_CAPSULE | ORAL | Status: DC

## 2012-05-24 NOTE — Telephone Encounter (Signed)
Called to say she called to make appt but they would not do it with out referral - needs this asap.  Dr Luiz Ochoa [facility] Fax [phone] - [place]

## 2012-05-24 NOTE — Telephone Encounter (Signed)
Put referral in for neuro.

## 2012-05-25 NOTE — Assessment & Plan Note (Signed)
Patient with new onset change in sensation of right thigh. Potentially related to nerve compression from disc in back vs previous hip surgery vs related to compression from sitting in wheel chair for long periods. Other potential cause would be B12 deficiency. Plan: Checked B12 and was normal. Advised to call his neurologist and was told needed referral for EMG study. Referral placed for neurology evaluation.

## 2012-05-25 NOTE — Assessment & Plan Note (Addendum)
Checked given history and last checked 4 years ago. Found to be low. Plan: will supplement with vit D 50,000 IU weekly for 6 weeks. Will need recheck after completion of course and likely will need daily supplementation following this course of supplementation.

## 2012-05-25 NOTE — Progress Notes (Signed)
  Subjective:    Patient ID: [name], male    DOB: 1965/02/23, 47 y.o.   MRN: [id number]  HPI Patient is a 47 yo male with CP who presents for complaint of right leg tingling.  States feels as though right thigh has fallen asleep and stayed tingly. Can move leg. States balance is off, though still has strength. Not feeling pain in right thigh. States mostly in anterior right thigh. Denies back pain. No change in bowel or bladder function. Denies saddle anesthesia. Notably patient states had hip surgery in the past and had pins placed. Has history of RLE edema that patient states is at baseline.  Review of Systems see HPI     Objective:   Physical Exam  Constitutional: He appears well-developed and well-nourished.  HENT:  Head: Normocephalic and atraumatic.  Cardiovascular: Normal rate, regular rhythm and normal heart sounds.   Pulmonary/Chest: Effort normal and breath sounds normal.  Musculoskeletal: He exhibits edema (RLE).  Neurological: He is alert.  5/5 strength in bilateral LE, sensation to light touch and pin prick decreased in right anterior thigh and posterior thigh (states these feel itchy), 2+ patellar and achilles reflexes bilaterally, sensation intact in LLE  BP 124/78  Ht 5\' 9"  (1.753 m)  Wt 210 lb (95.255 kg)  BMI 31 kg/m2    Assessment & Plan:

## 2012-06-09 ENCOUNTER — Ambulatory Visit (INDEPENDENT_AMBULATORY_CARE_PROVIDER_SITE_OTHER): Payer: Medicare Other | Admitting: Neurology

## 2012-06-09 ENCOUNTER — Encounter: Payer: Self-pay | Admitting: Neurology

## 2012-06-09 VITALS — BP 136/99 | HR 80

## 2012-06-09 DIAGNOSIS — R209 Unspecified disturbances of skin sensation: Secondary | ICD-10-CM

## 2012-06-09 DIAGNOSIS — R200 Anesthesia of skin: Secondary | ICD-10-CM

## 2012-06-09 DIAGNOSIS — G809 Cerebral palsy, unspecified: Secondary | ICD-10-CM

## 2012-06-09 NOTE — Progress Notes (Signed)
Reason for visit: Numbness  [name] is a 47 y.o. male  History of present illness:  [name] is a 47 year old right-handed white male with a history of cerebral palsy. The patient has a gait disorder associated with this, and he has a history of significant cervical spine disease requiring extensive surgery. The patient has indicated a change in his neurologic condition that occurred relatively suddenly three weeks ago. Since the summer of 2013, the patient has had intermittent tingling sensations into the medial aspect of the right forearm. Three weeks ago, the patient awakened with numbness that included the entire right lower extremity, going up into the lower rib cage on the right. The patient has noted that his strength in the legs is well-maintained, but he feels that the right leg has become clumsy, and he has difficulty in bearing weight because of this. The patient has noted that on occasion, and he will have a shocklike sensation that goes down into the right arm and right leg at the same time. The patient denies any change in the bowel or bladder control, and he denies any symptoms on the left arm or left leg. The patient comes to this office for an evaluation. The patient has no numbness on the face, and he denies any change in vision or speech or swallowing.  Past Medical History  Diagnosis Date  . Migraine   . Cerebral palsy   . Gait disorder   . Vitamin D deficiency   . Asthma   . Migraine   . Obesity   . Peripheral edema     Past Surgical History  Procedure Laterality Date  . Lung surgery  2004  . Cervical fusion    . Cholecystectomy    . Laminectomy lumbar spline w/ placement spinal cord stimulator    . Tendon transfer      Multiple, both legs  . Intertrochanteric hip fracture surgery Right     Family History  Problem Relation Age of Onset  . Cancer - Lung Maternal Uncle   . Diabetes Maternal Grandmother   . Cancer Maternal Grandfather     Stomach     Social history:  reports that he has never smoked. He does not have any smokeless tobacco history on file. He reports that  drinks alcohol. His drug history is not on file.  Medications:  Current Outpatient Prescriptions on File Prior to Visit  Medication Sig Dispense Refill  . acetaminophen (TYLENOL ARTHRITIS PAIN) 650 MG CR tablet Take 650 mg by mouth daily as needed.        Marland Kitchen albuterol (VENTOLIN HFA) 108 (90 BASE) MCG/ACT inhaler Inhale 2 puffs into the lungs every 4 (four) hours as needed for wheezing or shortness of breath.  1 Inhaler  3  . Cholecalciferol ([employer] VIT D 5000 HIGH-POTENCY) 5000 UNITS capsule Take 10 capsules (50,000 Units total) by mouth once a week.  60 capsule  0  . QVAR 80 MCG/ACT inhaler INHALE 2 PUFFS INTO THE LUNGS 2 (TWO) TIMES DAILY. RINSE THE MOUTH AFTER USE  8.7 g  3  . albuterol (ACCUNEB) 1.25 MG/3ML nebulizer solution Take 1 ampule by nebulization every 6 (six) hours as needed. For wheezing        No current facility-administered medications on file prior to visit.    Allergies: No Known Allergies  ROS:  Out of a complete 14 system review of symptoms, the patient complains only of the following symptoms, and all other reviewed systems are negative.  Swelling, right leg Numbness, right leg Gait disturbance  Blood pressure 136/99, pulse 80.  Physical Exam  General: The patient is alert and cooperative at the time of the examination.  Head: Pupils are equal, round, and reactive to light. Discs are flat bilaterally.  Neck: The neck is supple, no carotid bruits are noted.  Respiratory: The respiratory examination is clear.  Cardiovascular: The cardiovascular examination reveals a regular rate and rhythm, no obvious murmurs or rubs are noted.  Skin: Extremities are without significant edema.  Neurologic Exam  Mental status:  Cranial nerves: Facial symmetry is present. There is good sensation of the face to pinprick and soft touch bilaterally.  The strength of the facial muscles and the muscles to head turning and shoulder shrug are normal bilaterally. Speech is slightly ataxic, and dysarthric, not aphasic. Extraocular movements are divergent, poor medial deviation of each eye is noted. Visual fields are full.  Motor: The motor testing reveals 5 over 5 strength of all 4 extremities, with the exception that the patient has difficulty with full grip with both hands, cannot fully extend the arm, and he has some (4/5) proximal strength of the legs, right slightly weaker than the left. Good symmetric motor tone is noted throughout.  Sensory: Sensory testing is intact to pinprick, soft touch, vibration sensation, and position sense on all 4 extremities. No evidence of extinction is noted.  Coordination: Cerebellar testing reveals difficulty performing finger-nose-finger bilaterally, and heel-to-shin bilaterally.  Gait and station: Gait was not tested, the patient is wheelchair-bound.  Reflexes: Deep tendon reflexes are symmetric and normal bilaterally. Toes are downgoing bilaterally.   Assessment/Plan:  1. Cerebral palsy  2. Right leg and arm numbness  3. Cervical spine disease, spinal fusion  4. Gait disturbance  The patient has had an alteration in his clinical condition with onset of numbness involving the right leg that is persistent, and intermittent numbness of the right arm. The patient has noted a Lhermitte's phenomenon with shocklike sensations down the right arm and right leg concurrently. This reason, the patient will need to be evaluated for spinal cord compression possibly affecting the left side of the spinal cord. The patient has a spinal stimulator in place, and he cannot have MRI evaluation. The patient will be set up for a cervical myelogram with CT to follow. The patient will have a CT scan of the brain. The patient should also be considered for possible demyelinating disease, and spinal fluid analysis will be done. The  patient will followup in 3 months.  Marlan Palau MD 06/09/2012 12:53 PM  [facility] [address] [place], [place] [address]  Phone [phone] Fax [phone]

## 2012-06-09 NOTE — Patient Instructions (Addendum)
We will get a CT of the head and Ct of the cervical spine with a myelogram.

## 2012-06-13 ENCOUNTER — Telehealth: Payer: Self-pay

## 2012-06-13 NOTE — Telephone Encounter (Signed)
Spoke w/ Clydie Braun to clarify CT orders.

## 2012-06-13 NOTE — Telephone Encounter (Signed)
Message copied by Doree Barthel on Mon Jun 13, 2012 11:38 AM ------      Message from: Richrd Prime      Created: Mon Jun 13, 2012  9:52 AM      Contact: Clydie Braun from [facility]        Please call Clydie Braun regarding this patient's order asap Her call back number is [phone] ------

## 2012-06-14 ENCOUNTER — Other Ambulatory Visit: Payer: Medicare Other

## 2012-06-20 ENCOUNTER — Encounter: Payer: Self-pay | Admitting: Neurology

## 2012-06-20 ENCOUNTER — Ambulatory Visit
Admission: RE | Admit: 2012-06-20 | Discharge: 2012-06-20 | Disposition: A | Discharge status: Home / Self Care | Payer: Medicare Other | Source: Ambulatory Visit | Attending: Neurology | Admitting: Neurology

## 2012-06-20 ENCOUNTER — Telehealth: Payer: Self-pay | Admitting: Neurology

## 2012-06-20 DIAGNOSIS — G809 Cerebral palsy, unspecified: Secondary | ICD-10-CM

## 2012-06-20 DIAGNOSIS — R269 Unspecified abnormalities of gait and mobility: Secondary | ICD-10-CM

## 2012-06-20 DIAGNOSIS — R200 Anesthesia of skin: Secondary | ICD-10-CM

## 2012-06-20 DIAGNOSIS — M4712 Other spondylosis with myelopathy, cervical region: Secondary | ICD-10-CM

## 2012-06-20 DIAGNOSIS — R202 Paresthesia of skin: Secondary | ICD-10-CM

## 2012-06-20 HISTORY — DX: Other spondylosis with myelopathy, cervical region: M47.12

## 2012-06-20 MED ORDER — IOHEXOL 300 MG/ML  SOLN
10.0000 mL | Freq: Once | INTRAMUSCULAR | Status: AC | PRN
  Administered 2012-06-20: 10 mL via INTRAVENOUS

## 2012-06-20 NOTE — Progress Notes (Signed)
Discharge instructions explained to patient and his parents.  jkl

## 2012-06-20 NOTE — Progress Notes (Signed)
Patient resting quietly on stretcher in nursing station with mom and dad at bedside.  jkl

## 2012-06-20 NOTE — Telephone Encounter (Signed)
I called the mother and I discussed the myelogram with CT to follow up for the cervical spine. There is a block at C6-7 suggesting spinal cord compression at that level. The patient's clinical history is suggestive of spinal cord compression as well. I will refer this gentleman to Dr. Danielle Dess for an evaluation.

## 2012-06-21 ENCOUNTER — Telehealth: Payer: Self-pay | Admitting: *Deleted

## 2012-06-21 NOTE — Telephone Encounter (Signed)
Returned call for NPI - no answer - will try again tomorrow. Janny Crute, Harold Hedge, RN

## 2012-06-22 ENCOUNTER — Telehealth: Payer: Self-pay | Admitting: *Deleted

## 2012-06-22 NOTE — Telephone Encounter (Signed)
NPI #number given as requested. Irven Ingalsbe, Harold Hedge, RN

## 2012-06-22 NOTE — Telephone Encounter (Signed)
Sandy from [facility] called and given NPI # as requested for pt. [name], Harold Hedge, RN

## 2012-07-13 ENCOUNTER — Ambulatory Visit (INDEPENDENT_AMBULATORY_CARE_PROVIDER_SITE_OTHER): Payer: Medicare Other | Admitting: Family Medicine

## 2012-07-13 ENCOUNTER — Encounter: Payer: Self-pay | Admitting: Family Medicine

## 2012-07-13 VITALS — BP 135/93 | HR 81 | Temp 98.0°F

## 2012-07-13 DIAGNOSIS — E559 Vitamin D deficiency, unspecified: Secondary | ICD-10-CM

## 2012-07-13 DIAGNOSIS — J4520 Mild intermittent asthma, uncomplicated: Secondary | ICD-10-CM

## 2012-07-13 DIAGNOSIS — I10 Essential (primary) hypertension: Secondary | ICD-10-CM

## 2012-07-13 DIAGNOSIS — J45909 Unspecified asthma, uncomplicated: Secondary | ICD-10-CM

## 2012-07-13 MED ORDER — ERGOCALCIFEROL 1.25 MG (50000 UT) PO CAPS: 50000.0000 [IU] | ORAL_CAPSULE | ORAL | Status: DC

## 2012-07-13 MED ORDER — ALBUTEROL SULFATE HFA 108 (90 BASE) MCG/ACT IN AERS: 2.0000 | INHALATION_SPRAY | RESPIRATORY_TRACT | Status: DC | PRN

## 2012-07-13 MED ORDER — LEVETIRACETAM 500 MG PO TABS: 500.0000 mg | ORAL_TABLET | ORAL | Status: DC

## 2012-07-13 NOTE — Progress Notes (Signed)
  Subjective:    Patient ID: [name], male    DOB: 05/05/1965, 47 y.o.   MRN: [id number]  HPI:  [name] comes in for follow up.  He is doing relatively well besides the cervical spondylosis.  He is scheduled to see Neurosurgery.   Vit D deficiency: has taken Vit D 50,000 units weekly x 6 weeks, was instructed he had to come in to office for refill.  Also spending some time outside now that it is nice  Seizure Disorder: Taking Keppra every other day, no seizures, doing well.   Asthma: Taking QVAR wich helps tremendously, does not need albuterol nearly as much.  Has not had issues with wheezing this spring.   Past Medical History  Diagnosis Date  . Migraine   . Cerebral palsy   . Gait disorder   . Vitamin D deficiency   . Asthma   . Migraine   . Obesity   . Peripheral edema   . Cervical spondylosis with myelopathy 06/20/2012    History  Substance Use Topics  . Smoking status: Never Smoker   . Smokeless tobacco: Not on file  . Alcohol Use: Yes     Comment: Drinks wine once a month occasionally    Family History  Problem Relation Age of Onset  . Cancer - Lung Maternal Uncle   . Diabetes Maternal Grandmother   . Cancer Maternal Grandfather     Stomach   ROS Pertinent items in HPI    Objective:  Physical Exam:  BP 135/93  Pulse 81  Temp(Src) 98 F (36.7 C) (Oral) General appearance: alert, cooperative and no distress Lungs: clear to auscultation bilaterally Heart: regular rate and rhythm, S1, S2 normal, no murmur, click, rub or gallop Pulses: 2+ and symmetric Neuro: CP changes with spacticity and poor motor control.        Assessment & Plan:

## 2012-07-13 NOTE — Patient Instructions (Addendum)
It was good to see you.  I sent in a prescription for Vitamin D- this will be a bigger pill so you only have to take one pill once a week.   Please come back for a Vitamin D check around August.

## 2012-07-13 NOTE — Assessment & Plan Note (Signed)
Hx of, not on medications and well controlled, cont monitoring.

## 2012-07-13 NOTE — Assessment & Plan Note (Signed)
Will continue replacement dose Vitamin D, cannot check level more than every 3 months, would recommend checking it again in about 6-8 weeks to see if he needs to continue replacement dose.

## 2012-07-13 NOTE — Assessment & Plan Note (Signed)
Much improve don QVAR, will continue at current dose, albuterol refilled.

## 2012-08-11 ENCOUNTER — Other Ambulatory Visit: Payer: Self-pay | Admitting: Family Medicine

## 2012-08-11 ENCOUNTER — Other Ambulatory Visit: Payer: Self-pay | Admitting: *Deleted

## 2012-08-12 ENCOUNTER — Other Ambulatory Visit: Payer: Self-pay | Admitting: *Deleted

## 2012-08-12 MED ORDER — BECLOMETHASONE DIPROPIONATE 80 MCG/ACT IN AERS: INHALATION_SPRAY | RESPIRATORY_TRACT | Status: DC

## 2012-09-01 ENCOUNTER — Other Ambulatory Visit: Payer: Self-pay | Admitting: Neurological Surgery

## 2012-09-01 DIAGNOSIS — M5000 Cervical disc disorder with myelopathy, unspecified cervical region: Secondary | ICD-10-CM

## 2012-09-07 ENCOUNTER — Ambulatory Visit
Admission: RE | Admit: 2012-09-07 | Discharge: 2012-09-07 | Disposition: A | Discharge status: Home / Self Care | Payer: Medicare Other | Source: Ambulatory Visit | Attending: Neurological Surgery | Admitting: Neurological Surgery

## 2012-09-07 DIAGNOSIS — M5000 Cervical disc disorder with myelopathy, unspecified cervical region: Secondary | ICD-10-CM

## 2012-09-14 ENCOUNTER — Other Ambulatory Visit: Payer: Self-pay | Admitting: Neurological Surgery

## 2012-09-15 ENCOUNTER — Encounter (HOSPITAL_COMMUNITY): Payer: Self-pay | Admitting: Pharmacy Technician

## 2012-09-22 ENCOUNTER — Encounter (HOSPITAL_COMMUNITY)
Admission: RE | Admit: 2012-09-22 | Discharge: 2012-09-22 | Disposition: A | Discharge status: Home / Self Care | Payer: Medicare Other | Source: Ambulatory Visit | Attending: Anesthesiology | Admitting: Anesthesiology

## 2012-09-22 ENCOUNTER — Encounter (HOSPITAL_COMMUNITY)
Admission: RE | Admit: 2012-09-22 | Discharge: 2012-09-22 | Disposition: A | Discharge status: Home / Self Care | Payer: Medicare Other | Source: Ambulatory Visit | Attending: Neurological Surgery | Admitting: Neurological Surgery

## 2012-09-22 ENCOUNTER — Encounter (HOSPITAL_COMMUNITY): Payer: Self-pay

## 2012-09-22 HISTORY — DX: Bronchitis, not specified as acute or chronic: J40

## 2012-09-22 HISTORY — DX: Pneumonia, unspecified organism: J18.9

## 2012-09-22 HISTORY — DX: Other pulmonary collapse: J98.19

## 2012-09-22 LAB — BASIC METABOLIC PANEL
CO2: 27 mEq/L (ref 19–32)
Chloride: 100 mEq/L (ref 96–112)
Creatinine, Ser: 0.66 mg/dL (ref 0.50–1.35)
GFR calc Af Amer: >90 mL/min (ref >90)
Potassium: 3.7 mEq/L (ref 3.5–5.1)
Sodium: 140 mEq/L (ref 135–145)

## 2012-09-22 LAB — SURGICAL PCR SCREEN: Staphylococcus aureus: NEGATIVE

## 2012-09-22 LAB — CBC
MCV: 83.9 fL (ref 78.0–100.0)
Platelets: 227 10*3/uL (ref 150–400)
RBC: 4.96 MIL/uL (ref 4.22–5.81)
RDW: 13.1 % (ref 11.5–15.5)
WBC: 8.4 10*3/uL (ref 4.0–10.5)

## 2012-09-22 NOTE — Progress Notes (Signed)
Primary physician - dr. Althia Forts Does not have a cardiologist No prior cardiac testing

## 2012-09-22 NOTE — Progress Notes (Signed)
Anesthesia Chart Review:  Patient is a 47 year old male scheduled for C6-7 ACDF on 09/26/12 by Dr. Danielle Dess.  History includes non-smoker, cerebral palsy, gait disorder, obesity, asthma, migraines, spinal cord stimulator, cholecystectomy, right hip fracture, cervical fusion, left pneumothorax, history of PNA and bronchitis. PCP is thru [facility], last visit with Dr. Ardyth Gal in May 2014.  EKG on 09/22/12 showed NSR.  Preoperative labs noted.  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 No edema or consolidation. Reviewed results with anesthesiologist Dr .Katrinka Blazing. Results faxed to [facility], attn: Dr. Lula Olszewski and message left with Shanda Bumps at Dr. Verlee Rossetti office.  He has scoliosis and showed a moderate hiatal hernia on previous films.  Patient reported reflux to his PAT RN, but otherwise no significant GI symptoms.  If no acute changes then I would anticipate that he could proceed as planned.  Velna Ochs [facility] Short Stay Center/Anesthesiology Phone [phone] 09/22/2012 4:11 PM

## 2012-09-22 NOTE — Pre-Procedure Instructions (Signed)
JAMAUL HEIST  09/22/2012   Your procedure is scheduled on:  Monday, August 11th  Report to [facility] at 130 PM.  Call this number if you have problems the morning of surgery: [phone]   Remember:   Do not eat food or drink liquids after midnight.   Take these medicines the morning of surgery with A SIP OF WATER: keppra, qvar inhaler, tylenol if needed, albuterol if needed   Do not wear jewelry.  Do not wear lotions, powders, or perfumes. You may wear deodorant.  Do not bring valuables to the hospital.  [facility] is not responsible  for any belongings or valuables.  Contacts, dentures or bridgework may not be worn into surgery.  Leave suitcase in the car. After surgery it may be brought to your room.  For patients admitted to the hospital, checkout time is 11:00 AM the day of discharge.   Patients discharged the day of surgery will not be allowed to drive home.  Special Instructions: Shower using CHG 2 nights before surgery and the night before surgery.  If you shower the day of surgery use CHG.  Use special wash - you have one bottle of CHG for all showers.  You should use approximately 1/3 of the bottle for each shower.   Please read over the following fact sheets that you were given: Pain Booklet, Coughing and Deep Breathing, MRSA Information and Surgical Site Infection Prevention

## 2012-09-25 MED ORDER — CEFAZOLIN SODIUM-DEXTROSE 2-3 GM-% IV SOLR
2.0000 g | INTRAVENOUS | Status: AC
  Administered 2012-09-26: 2 g via INTRAVENOUS
  Filled 2012-09-25: qty 50

## 2012-09-26 ENCOUNTER — Encounter (HOSPITAL_COMMUNITY): Payer: Self-pay | Admitting: *Deleted

## 2012-09-26 ENCOUNTER — Inpatient Hospital Stay (HOSPITAL_COMMUNITY): Payer: Medicare Other | Admitting: Anesthesiology

## 2012-09-26 ENCOUNTER — Encounter (HOSPITAL_COMMUNITY): Payer: Self-pay | Admitting: Vascular Surgery

## 2012-09-26 ENCOUNTER — Inpatient Hospital Stay (HOSPITAL_COMMUNITY): Payer: Medicare Other

## 2012-09-26 ENCOUNTER — Encounter (HOSPITAL_COMMUNITY)
Admission: RE | Disposition: A | Discharge status: Home / Self Care | Payer: Self-pay | Source: Ambulatory Visit | Attending: Neurological Surgery

## 2012-09-26 ENCOUNTER — Inpatient Hospital Stay (HOSPITAL_COMMUNITY)
Admission: RE | Admit: 2012-09-26 | Discharge: 2012-09-27 | DRG: 471 | Disposition: A | Discharge status: Home / Self Care | Payer: Medicare Other | Source: Ambulatory Visit | Attending: Neurological Surgery | Admitting: Neurological Surgery

## 2012-09-26 DIAGNOSIS — M5000 Cervical disc disorder with myelopathy, unspecified cervical region: Principal | ICD-10-CM | POA: Diagnosis present

## 2012-09-26 DIAGNOSIS — R259 Unspecified abnormal involuntary movements: Secondary | ICD-10-CM | POA: Diagnosis present

## 2012-09-26 DIAGNOSIS — Z9089 Acquired absence of other organs: Secondary | ICD-10-CM

## 2012-09-26 DIAGNOSIS — Z833 Family history of diabetes mellitus: Secondary | ICD-10-CM

## 2012-09-26 DIAGNOSIS — G8250 Quadriplegia, unspecified: Secondary | ICD-10-CM | POA: Diagnosis present

## 2012-09-26 DIAGNOSIS — Z993 Dependence on wheelchair: Secondary | ICD-10-CM

## 2012-09-26 DIAGNOSIS — Z79899 Other long term (current) drug therapy: Secondary | ICD-10-CM

## 2012-09-26 DIAGNOSIS — M50223 Other cervical disc displacement at C6-C7 level: Secondary | ICD-10-CM | POA: Diagnosis present

## 2012-09-26 DIAGNOSIS — Z981 Arthrodesis status: Secondary | ICD-10-CM

## 2012-09-26 DIAGNOSIS — Z01812 Encounter for preprocedural laboratory examination: Secondary | ICD-10-CM

## 2012-09-26 HISTORY — PX: ANTERIOR CERVICAL DECOMP/DISCECTOMY FUSION: SHX1161

## 2012-09-26 SURGERY — ANTERIOR CERVICAL DECOMPRESSION/DISCECTOMY FUSION 1 LEVEL
Anesthesia: General | Site: Neck | Wound class: Clean

## 2012-09-26 MED ORDER — LACTATED RINGERS IV SOLN
INTRAVENOUS | Status: DC
  Administered 2012-09-26: 14:00:00 via INTRAVENOUS

## 2012-09-26 MED ORDER — ACETAMINOPHEN 650 MG RE SUPP: 650.0000 mg | RECTAL | Status: DC | PRN

## 2012-09-26 MED ORDER — DEXAMETHASONE SODIUM PHOSPHATE 10 MG/ML IJ SOLN
INTRAMUSCULAR | Status: DC | PRN
  Administered 2012-09-26: 10 mg via INTRAVENOUS

## 2012-09-26 MED ORDER — CEFAZOLIN SODIUM 1-5 GM-% IV SOLN
1.0000 g | Freq: Three times a day (TID) | INTRAVENOUS | Status: AC
  Administered 2012-09-26 – 2012-09-27 (×2): 1 g via INTRAVENOUS
  Filled 2012-09-26 (×2): qty 50

## 2012-09-26 MED ORDER — MENTHOL 3 MG MT LOZG: 1.0000 | LOZENGE | OROMUCOSAL | Status: DC | PRN

## 2012-09-26 MED ORDER — EPHEDRINE SULFATE 50 MG/ML IJ SOLN
INTRAMUSCULAR | Status: DC | PRN
  Administered 2012-09-26 (×2): 5 mg via INTRAVENOUS

## 2012-09-26 MED ORDER — LACTATED RINGERS IV SOLN
INTRAVENOUS | Status: DC | PRN
  Administered 2012-09-26 (×2): via INTRAVENOUS

## 2012-09-26 MED ORDER — LIDOCAINE HCL (PF) 2 % IJ SOLN
INTRAMUSCULAR | Status: DC | PRN
  Administered 2012-09-26: 7 mL

## 2012-09-26 MED ORDER — BACITRACIN 50000 UNITS IM SOLR
INTRAMUSCULAR | Status: AC
  Filled 2012-09-26: qty 1

## 2012-09-26 MED ORDER — SODIUM CHLORIDE 0.9 % IJ SOLN: 3.0000 mL | INTRAMUSCULAR | Status: DC | PRN

## 2012-09-26 MED ORDER — ROCURONIUM BROMIDE 100 MG/10ML IV SOLN
INTRAVENOUS | Status: DC | PRN
  Administered 2012-09-26: 30 mg via INTRAVENOUS

## 2012-09-26 MED ORDER — PROPOFOL 10 MG/ML IV BOLUS
INTRAVENOUS | Status: DC | PRN
  Administered 2012-09-26: 150 mg via INTRAVENOUS

## 2012-09-26 MED ORDER — DEXMEDETOMIDINE HCL IN NACL 200 MCG/50ML IV SOLN: 0.4000 ug/kg/h | Freq: Once | INTRAVENOUS | Status: DC

## 2012-09-26 MED ORDER — ALBUTEROL SULFATE HFA 108 (90 BASE) MCG/ACT IN AERS
2.0000 | INHALATION_SPRAY | RESPIRATORY_TRACT | Status: DC | PRN
  Filled 2012-09-26: qty 6.7

## 2012-09-26 MED ORDER — LOPERAMIDE HCL 2 MG PO CAPS
4.0000 mg | ORAL_CAPSULE | Freq: Every day | ORAL | Status: DC
  Filled 2012-09-26 (×2): qty 2

## 2012-09-26 MED ORDER — SODIUM CHLORIDE 0.9 % IV SOLN
INTRAVENOUS | Status: AC
  Filled 2012-09-26: qty 500

## 2012-09-26 MED ORDER — NEOSTIGMINE METHYLSULFATE 1 MG/ML IJ SOLN
INTRAMUSCULAR | Status: DC | PRN
  Administered 2012-09-26: 4 mg via INTRAVENOUS

## 2012-09-26 MED ORDER — OXYCODONE HCL 5 MG PO TABS: 5.0000 mg | ORAL_TABLET | Freq: Once | ORAL | Status: DC | PRN

## 2012-09-26 MED ORDER — FENTANYL CITRATE 0.05 MG/ML IJ SOLN: 25.0000 ug | INTRAMUSCULAR | Status: DC | PRN

## 2012-09-26 MED ORDER — FENTANYL CITRATE 0.05 MG/ML IJ SOLN
INTRAMUSCULAR | Status: DC | PRN
  Administered 2012-09-26 (×3): 50 ug via INTRAVENOUS

## 2012-09-26 MED ORDER — GLYCOPYRROLATE 0.2 MG/ML IJ SOLN
INTRAMUSCULAR | Status: DC | PRN
  Administered 2012-09-26: 0.6 mg via INTRAVENOUS

## 2012-09-26 MED ORDER — 0.9 % SODIUM CHLORIDE (POUR BTL) OPTIME
TOPICAL | Status: DC | PRN
  Administered 2012-09-26: 1000 mL

## 2012-09-26 MED ORDER — MIDAZOLAM HCL 5 MG/5ML IJ SOLN
INTRAMUSCULAR | Status: DC | PRN
  Administered 2012-09-26: 2 mg via INTRAVENOUS

## 2012-09-26 MED ORDER — OXYCODONE HCL 5 MG/5ML PO SOLN: 5.0000 mg | Freq: Once | ORAL | Status: DC | PRN

## 2012-09-26 MED ORDER — SODIUM CHLORIDE 0.9 % IJ SOLN: 3.0000 mL | Freq: Two times a day (BID) | INTRAMUSCULAR | Status: DC

## 2012-09-26 MED ORDER — OXYCODONE-ACETAMINOPHEN 5-325 MG PO TABS
1.0000 | ORAL_TABLET | ORAL | Status: DC | PRN
  Administered 2012-09-27: 2 via ORAL
  Filled 2012-09-26: qty 2

## 2012-09-26 MED ORDER — BUPIVACAINE HCL (PF) 0.5 % IJ SOLN
INTRAMUSCULAR | Status: DC | PRN
  Administered 2012-09-26: 4 mL

## 2012-09-26 MED ORDER — HYDROCODONE-ACETAMINOPHEN 5-325 MG PO TABS: 1.0000 | ORAL_TABLET | ORAL | Status: DC | PRN

## 2012-09-26 MED ORDER — ALBUTEROL SULFATE (5 MG/ML) 0.5% IN NEBU: 2.5000 mg | INHALATION_SOLUTION | Freq: Four times a day (QID) | RESPIRATORY_TRACT | Status: DC | PRN

## 2012-09-26 MED ORDER — THROMBIN 5000 UNITS EX SOLR
OROMUCOSAL | Status: DC | PRN
  Administered 2012-09-26: 17:00:00 via TOPICAL

## 2012-09-26 MED ORDER — ONDANSETRON HCL 4 MG/2ML IJ SOLN
INTRAMUSCULAR | Status: DC | PRN
  Administered 2012-09-26: 4 mg via INTRAVENOUS

## 2012-09-26 MED ORDER — PHENYLEPHRINE HCL 10 MG/ML IJ SOLN
INTRAMUSCULAR | Status: DC | PRN
  Administered 2012-09-26 (×5): 40 ug via INTRAVENOUS

## 2012-09-26 MED ORDER — PHENOL 1.4 % MT LIQD: 1.0000 | OROMUCOSAL | Status: DC | PRN

## 2012-09-26 MED ORDER — THROMBIN 5000 UNITS EX SOLR
CUTANEOUS | Status: DC | PRN
  Administered 2012-09-26 (×2): 5000 [IU] via TOPICAL

## 2012-09-26 MED ORDER — LEVETIRACETAM 500 MG PO TABS
500.0000 mg | ORAL_TABLET | ORAL | Status: DC
  Filled 2012-09-26: qty 1

## 2012-09-26 MED ORDER — ONDANSETRON HCL 4 MG/2ML IJ SOLN: 4.0000 mg | INTRAMUSCULAR | Status: DC | PRN

## 2012-09-26 MED ORDER — FLUTICASONE PROPIONATE HFA 44 MCG/ACT IN AERO
2.0000 | INHALATION_SPRAY | Freq: Two times a day (BID) | RESPIRATORY_TRACT | Status: DC
  Filled 2012-09-26 (×2): qty 10.6

## 2012-09-26 MED ORDER — SODIUM CHLORIDE 0.9 % IR SOLN
Status: DC | PRN
  Administered 2012-09-26: 16:00:00

## 2012-09-26 MED ORDER — ALBUTEROL SULFATE (2.5 MG/3ML) 0.083% IN NEBU
1.2500 mg | INHALATION_SOLUTION | Freq: Four times a day (QID) | RESPIRATORY_TRACT | Status: DC | PRN
  Filled 2012-09-26: qty 3

## 2012-09-26 MED ORDER — ARTIFICIAL TEARS OP OINT
TOPICAL_OINTMENT | OPHTHALMIC | Status: DC | PRN
  Administered 2012-09-26: 1 via OPHTHALMIC

## 2012-09-26 MED ORDER — DEXMEDETOMIDINE HCL IN NACL 200 MCG/50ML IV SOLN
0.4000 ug/kg/h | INTRAVENOUS | Status: DC
  Administered 2012-09-26: 90 ug/h via INTRAVENOUS
  Filled 2012-09-26: qty 50

## 2012-09-26 MED ORDER — LIDOCAINE HCL (CARDIAC) 20 MG/ML IV SOLN
INTRAVENOUS | Status: DC | PRN
  Administered 2012-09-26: 60 mg via INTRAVENOUS

## 2012-09-26 MED ORDER — LIDOCAINE-EPINEPHRINE 1 %-1:100000 IJ SOLN
INTRAMUSCULAR | Status: DC | PRN
  Administered 2012-09-26: 4 mL

## 2012-09-26 MED ORDER — ALUM & MAG HYDROXIDE-SIMETH 200-200-20 MG/5ML PO SUSP: 30.0000 mL | Freq: Four times a day (QID) | ORAL | Status: DC | PRN

## 2012-09-26 MED ORDER — METOCLOPRAMIDE HCL 5 MG/ML IJ SOLN: 10.0000 mg | Freq: Once | INTRAMUSCULAR | Status: DC | PRN

## 2012-09-26 MED ORDER — METHOCARBAMOL 100 MG/ML IJ SOLN
500.0000 mg | Freq: Four times a day (QID) | INTRAVENOUS | Status: DC | PRN
  Filled 2012-09-26: qty 5

## 2012-09-26 MED ORDER — HEMOSTATIC AGENTS (NO CHARGE) OPTIME
TOPICAL | Status: DC | PRN
  Administered 2012-09-26: 1 via TOPICAL

## 2012-09-26 MED ORDER — SODIUM CHLORIDE 0.9 % IV SOLN: 250.0000 mL | INTRAVENOUS | Status: DC

## 2012-09-26 MED ORDER — METHOCARBAMOL 500 MG PO TABS: 500.0000 mg | ORAL_TABLET | Freq: Four times a day (QID) | ORAL | Status: DC | PRN

## 2012-09-26 MED ORDER — MORPHINE SULFATE 2 MG/ML IJ SOLN
1.0000 mg | INTRAMUSCULAR | Status: DC | PRN
  Administered 2012-09-26: 2 mg via INTRAVENOUS
  Filled 2012-09-26: qty 1

## 2012-09-26 MED ORDER — ACETAMINOPHEN 325 MG PO TABS: 650.0000 mg | ORAL_TABLET | ORAL | Status: DC | PRN

## 2012-09-26 SURGICAL SUPPLY — 64 items
BAG DECANTER FOR FLEXI CONT (MISCELLANEOUS) ×2 IMPLANT
BANDAGE GAUZE ELAST BULKY 4 IN (GAUZE/BANDAGES/DRESSINGS) ×4 IMPLANT
BIT DRILL 14MM (INSTRUMENTS) ×1 IMPLANT
BIT DRILL NEURO 2X3.1 SFT TUCH (MISCELLANEOUS) ×1 IMPLANT
BUR BARREL STRAIGHT FLUTE 4.0 (BURR) ×2 IMPLANT
CANISTER SUCTION 2500CC (MISCELLANEOUS) ×2 IMPLANT
CLOTH BEACON ORANGE TIMEOUT ST (SAFETY) ×2 IMPLANT
CONT SPEC 4OZ CLIKSEAL STRL BL (MISCELLANEOUS) ×4 IMPLANT
DECANTER SPIKE VIAL GLASS SM (MISCELLANEOUS) ×2 IMPLANT
DERMABOND ADHESIVE PROPEN (GAUZE/BANDAGES/DRESSINGS) ×1
DERMABOND ADVANCED (GAUZE/BANDAGES/DRESSINGS) ×1
DERMABOND ADVANCED .7 DNX12 (GAUZE/BANDAGES/DRESSINGS) ×1 IMPLANT
DERMABOND ADVANCED .7 DNX6 (GAUZE/BANDAGES/DRESSINGS) ×1 IMPLANT
DRAPE LAPAROTOMY 100X72 PEDS (DRAPES) ×2 IMPLANT
DRAPE MICROSCOPE LEICA (MISCELLANEOUS) IMPLANT
DRAPE POUCH INSTRU U-SHP 10X18 (DRAPES) ×2 IMPLANT
DRESSING TELFA 8X3 (GAUZE/BANDAGES/DRESSINGS) ×2 IMPLANT
DRILL 14MM (INSTRUMENTS) ×2
DRILL NEURO 2X3.1 SOFT TOUCH (MISCELLANEOUS) ×2
DRSG OPSITE 4X5.5 SM (GAUZE/BANDAGES/DRESSINGS) ×2 IMPLANT
DURAPREP 6ML APPLICATOR 50/CS (WOUND CARE) ×2 IMPLANT
ELECT REM PT RETURN 9FT ADLT (ELECTROSURGICAL) ×2
ELECTRODE REM PT RTRN 9FT ADLT (ELECTROSURGICAL) ×1 IMPLANT
GAUZE SPONGE 4X4 16PLY XRAY LF (GAUZE/BANDAGES/DRESSINGS) IMPLANT
GLOVE BIO SURGEON STRL SZ 6.5 (GLOVE) ×2 IMPLANT
GLOVE BIO SURGEON STRL SZ7.5 (GLOVE) IMPLANT
GLOVE BIOGEL PI IND STRL 6.5 (GLOVE) ×1 IMPLANT
GLOVE BIOGEL PI IND STRL 7.0 (GLOVE) ×1 IMPLANT
GLOVE BIOGEL PI IND STRL 7.5 (GLOVE) IMPLANT
GLOVE BIOGEL PI IND STRL 8.5 (GLOVE) ×1 IMPLANT
GLOVE BIOGEL PI INDICATOR 6.5 (GLOVE) ×1
GLOVE BIOGEL PI INDICATOR 7.0 (GLOVE) ×1
GLOVE BIOGEL PI INDICATOR 7.5 (GLOVE)
GLOVE BIOGEL PI INDICATOR 8.5 (GLOVE) ×1
GLOVE ECLIPSE 6.5 STRL STRAW (GLOVE) ×2 IMPLANT
GLOVE ECLIPSE 7.5 STRL STRAW (GLOVE) ×2 IMPLANT
GLOVE ECLIPSE 8.5 STRL (GLOVE) ×2 IMPLANT
GLOVE EXAM NITRILE LRG STRL (GLOVE) IMPLANT
GLOVE EXAM NITRILE MD LF STRL (GLOVE) IMPLANT
GLOVE EXAM NITRILE XL STR (GLOVE) IMPLANT
GLOVE EXAM NITRILE XS STR PU (GLOVE) IMPLANT
GOWN BRE IMP SLV AUR LG STRL (GOWN DISPOSABLE) ×2 IMPLANT
GOWN BRE IMP SLV AUR XL STRL (GOWN DISPOSABLE) ×2 IMPLANT
GOWN STRL REIN 2XL LVL4 (GOWN DISPOSABLE) ×4 IMPLANT
HEAD HALTER (SOFTGOODS) ×2 IMPLANT
KIT BASIN OR (CUSTOM PROCEDURE TRAY) ×2 IMPLANT
KIT ROOM TURNOVER OR (KITS) ×2 IMPLANT
NEEDLE HYPO 22GX1.5 SAFETY (NEEDLE) ×2 IMPLANT
NEEDLE SPNL 22GX3.5 QUINCKE BK (NEEDLE) ×4 IMPLANT
NS IRRIG 1000ML POUR BTL (IV SOLUTION) ×2 IMPLANT
PACK LAMINECTOMY NEURO (CUSTOM PROCEDURE TRAY) ×2 IMPLANT
PAD ARMBOARD 7.5X6 YLW CONV (MISCELLANEOUS) ×6 IMPLANT
PLATE 14MM (Plate) ×2 IMPLANT
PUTTY BONE 1CC ×2 IMPLANT
RUBBERBAND STERILE (MISCELLANEOUS) IMPLANT
SCREW 14MM (Screw) ×8 IMPLANT
SPACER ASSEM CERV LORD 8M (Spacer) ×2 IMPLANT
SPONGE INTESTINAL PEANUT (DISPOSABLE) ×2 IMPLANT
SPONGE SURGIFOAM ABS GEL SZ50 (HEMOSTASIS) ×2 IMPLANT
SUT VIC AB 3-0 SH 8-18 (SUTURE) ×4 IMPLANT
SYR 20ML ECCENTRIC (SYRINGE) ×2 IMPLANT
TOWEL OR 17X24 6PK STRL BLUE (TOWEL DISPOSABLE) ×2 IMPLANT
TOWEL OR 17X26 10 PK STRL BLUE (TOWEL DISPOSABLE) ×2 IMPLANT
WATER STERILE IRR 1000ML POUR (IV SOLUTION) ×2 IMPLANT

## 2012-09-26 NOTE — H&P (Signed)
HISTORY OF PRESENT ILLNESS:                     [name] is a 47 year old right-handed individual who had an injury at 23 old that left him with a period of anoxia.  Subsequently, he has had difficulty with cerebral palsy like symptoms that involve significant weakness and disuse of his left upper extremity, weakness into his right lower extremity.  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  He has had a total of four surgeries in the craniocervical junction, last one was performed in 1997 by Dr. Juluis Mire where he underwent an occiput to C6 fusion posteriorly.  [name] has been quite functional, but he notes that in early May of this year, he developed significant weakness in his right lower extremity.  He started to have problems with transferring, started to have problems with balancing his weight onto his right leg.  His right leg has always been weaker than his left, but he notes that there was a significant change in the functionality of his right lower extremity.  At the same time, he started to develop some dysesthesias in the ulnar aspect of his forearm and hand on the right side also.  This was particularly noticeable as he is right-hand dominant.  He was ultimately seen by Dr. Lesia Sago and a myelogram was performed.  This study is reviewed in the office and it demonstrates that the patient had dye placed in the lumbar spine.  Because of his significant cervical fusion, he has difficulty with sitting upright or extending or being placed in a head-down position.  This made the transfer of dye into the cervical spine difficult; however, on images that are obtained, it appears that there is a high grade block to the flow of dye at the level of C6-C7.  Imaging of the dye flow through the thoracic or lumbar spine does not obtain other than to note that there is a good column of dye at the level of L3 down to the sacrum.  A CT scan  that was performed after the myelogram attempt demonstrates poor opacification of the cervical spinal canal even at the level of C7 or T1.  This is because again the patient has to sit at about a 25-degree upright angle because of the nature of his cervical fusion.  Hardware is noted in the posterior aspect of the cervical spine and this mostly appears to be consistent with stainless or titanium hardware for cervical fixation.  Given the year that this was performed in 1997, it is likely to represent titanium hardware.  He is seen now for further evaluation of this myelogram and suggestions as to appropriate intervention and therapy.  REVIEW OF SYSTEMS:                                    Notable for difficulty with swelling in his feet, particularly in his right lower extremity.  His mom, who accompanies him, notes that during a recent trip, he was in somewhat different chairs than he normally uses and he had no issues of swelling in the right leg, but otherwise his right leg and foot typically are seen to demonstrate significant swelling.  He has also had some issues with shortness of breath.  PAST MEDICAL HISTORY:    Current Medical Conditions:  His past medical  history reveals that his general health has been good.    Prior Operations:  Aside from the cervical fusion surgeries, he has had a cholecystectomy back in 2006 and this was complicated by significant breathing difficulties.  The patient reports that he does have significant difficulties with maintaining an airway during anesthesia.  He also had a collapsed lung around that time.    Medications and Allergies:  His current medications are QVAR instead of a rescue inhaler and also using some Keppra 500 mg a day in addition to the albuterol inhaler as needed plus a vitamin D supplement.  FAMILY HISTORY:                                            Reveals that there is a grandmother with diabetes, but otherwise there is no significant family medical  illnesses.  SOCIAL HISTORY:                                            He does not smoke or use alcohol.  PHYSICAL EXAMINATION:                                I note that at the current time, he appears wheelchair bound; however, he notes that he is able to transfer with the help in standing and balance.  This has been a major change in that he could help transfer and stand and balance himself much better previously.  He has 2+ pitting edema in the right lower extremity as noted.  In terms of strength, I note that he has 3/5 strength in the iliopsoas and the quad on the right compared to 4/5 strength on the left side.  He notes that his right side has always been weak, but is weaker now than it had been previously.  Similarly, as regard to his upper extremities, I note that he has good proximal and distal strength in the right upper extremity, biceps and triceps function being graded at least 4/5, extensor strength in the hand is at 5/5, intrinsic strength is at 4/5.  On the left side, he has significant weakness with the hand in an extensor splint.  He has slow dystonic movements of the left upper extremity compared to the right.  IMPRESSION AND PLAN:                                 The patient and I are concerned that he has had a significant recent change in function starting this past April or May.  He feels that he is losing his ability to stand and balance on his right lower extremity which, though it is weak, has become weaker.  Furthermore, he describes symptoms that he has been experiencing in an ulnar aspect of his right hand with dysesthesias in this region.  He has evidence of a high grade block at C6-C7 on myelography, but further detail could not be ascertained from the myelogram and postmyelogram CAT scan that he has recently had.       A recent Mri confirms high grade stenosis at C6-7 with cord compression and myelopathy.  He  is admitted to undergo decompression via a C6-7 discectomy and  fusion.

## 2012-09-26 NOTE — Preoperative (Signed)
Beta Blockers   Reason not to administer Beta Blockers:Not Applicable 

## 2012-09-26 NOTE — Anesthesia Postprocedure Evaluation (Signed)
  Anesthesia Post-op Note  Patient: [name]  Procedure(s) Performed: Procedure(s): Cervical six-seven Anterior cervical decompression/diskectomy/fusion (N/A)  Patient Location: PACU  Anesthesia Type:General  Level of Consciousness: awake and alert   Airway and Oxygen Therapy: Patient Spontanous Breathing  Post-op Pain: mild  Post-op Assessment: Post-op Vital signs reviewed, Patient's Cardiovascular Status Stable, Respiratory Function Stable, Patent Airway, No signs of Nausea or vomiting and Pain level controlled  Post-op Vital Signs: stable  Complications: No apparent anesthesia complications

## 2012-09-26 NOTE — Op Note (Addendum)
Date of surgery: 09/26/2012 Brief history: Patient is a 47 year old individual is had a previous occiput to C6 cervical fusion. He has developed degenerative changes at C6-C7 with a large central disc herniation causing spinal cord compression and intrinsic signal changes. The patient has had long-standing weakness in his left arm and right leg but now notes that his right arm and right leg are becoming even weaker as of a critical nature for him in regards to his generalized quadriparesis. He's been advised regarding the need to decompress and stabilize C6-C7.  Preoperative diagnosis: C6-C7 herniated nucleus pulposus with myelopathy  Postoperative diagnosis: C6-C7 herniated nucleus pulposus with myelopathy status post arthrodesis occiput to C6  Procedure: C6-C7 Anterior cervical decompression with the arthrodesis using structural  allograft. Anterior fixation using Alphatec trestle plating system C6-C7 Surgeon: Barnett Abu, MD Asst.: Barbaraann Barthel  Anesthesia: Gen. endotracheal  Estimated blood loss: Less than 50 cc  Drains: None  Complications: None  Indications: As in history above  Procedure: The patient was brought to the operating room on a stretcher was placed on the table in the supine position. General endotracheal anesthesia was induced the anterior border of the neck was inspected prepped with alcohol and DuraPrep and draped in a sterile fashion. Transverse incision was created in the anterior border of the neck and carried down through the platysma. The plane between the sternocleidomastoid and strap muscles dissected bluntly until the first prevertebral space was reached. This space was identified as C6-C7 on the radiograph. The dissection was then completed undermining longus coli muscle to allow placement of a self-retaining Caspar retractor. The anterior longitudinal ligament was then opened with a #15 blade and ventral osteophytes removed using a Estate manager/land agent. The disc space was evacuated of substantial quantity of the degenerated and desiccated disc material. Region of the posterior longitudinal ligament was reached and a self-retaining disc space spreader was placed into the interspace. A high-speed drill was used to remove bony osteophytes from the inferior margin of the superior endplate and also to remove overgrown lateral osteophytes and the uncinate processes. The nerve roots were decompressed using a 1 and 2 mm Kerrison punch. Hemostasis was established using small pledgets of Gelfoam soaked in thrombin and cottonoid patties. These were later irrigated away. The endplates were drilled smoothed with a 4 mm barrel bit.  An 8 mm cortical cancellus manufactured allograft was prepared and configured to fit in the interspace. It was then placed into the interspace. The anterior border of the vertebrae was then prepared for plating. A 18 mm plate was secured to the vertebral bodies with 14 mm millimeters screws. The vertebral hemostasis was then checked and secured with the bipolar cautery and Gelfoam soaked pledgets. These were later irrigated away. A final radiograph was obtained confirming position of the hardware. When hemostasis was secured the platysma was closed with 3-0 Vicryl and 3-0 Vicryl is used to close subcuticular skin.

## 2012-09-26 NOTE — Anesthesia Preprocedure Evaluation (Addendum)
Anesthesia Evaluation  Patient identified by MRN, date of birth, ID band Patient awake    Reviewed: Allergy & Precautions, H&P , NPO status , Patient's Chart, lab work & pertinent test results, reviewed documented beta blocker date and time   Airway Mallampati: IV TM Distance: <3 FB Neck ROM: limited  Mouth opening: Limited Mouth Opening  Dental  (+) Dental Advisory Given   Pulmonary asthma , pneumonia -, resolved,  breath sounds clear to auscultation        Cardiovascular hypertension, negative cardio ROS  Rhythm:regular     Neuro/Psych  Headaches,  Neuromuscular disease CVA, Residual Symptoms negative psych ROS   GI/Hepatic negative GI ROS, Neg liver ROS,   Endo/Other  negative endocrine ROS  Renal/GU negative Renal ROS  negative genitourinary   Musculoskeletal   Abdominal (+)  Abdomen: soft. Bowel sounds: normal.  Peds  (+) Neurological problem Hematology negative hematology ROS (+)   Anesthesia Other Findings See surgeon's H&P   Reproductive/Obstetrics negative OB ROS                          Anesthesia Physical Anesthesia Plan  ASA: II  Anesthesia Plan: General   Post-op Pain Management:    Induction: Intravenous  Airway Management Planned: Awake Intubation Planned and Fiberoptic Intubation Planned  Additional Equipment:   Intra-op Plan:   Post-operative Plan:   Informed Consent: I have reviewed the patients History and Physical, chart, labs and discussed the procedure including the risks, benefits and alternatives for the proposed anesthesia with the patient or authorized representative who has indicated his/her understanding and acceptance.   Dental Advisory Given  Plan Discussed with: CRNA and Surgeon  Anesthesia Plan Comments:         Anesthesia Quick Evaluation

## 2012-09-26 NOTE — Anesthesia Procedure Notes (Addendum)
Procedure Name: Intubation Date/Time: 09/26/2012 3:11 PM Performed by: Ellin Goodie Pre-anesthesia Checklist: Patient identified, Emergency Drugs available, Suction available, Patient being monitored and Timeout performed Patient Re-evaluated:Patient Re-evaluated prior to inductionOxygen Delivery Method: Circle system utilized Preoxygenation: Pre-oxygenation with 100% oxygen Intubation Type: IV induction Tube type: Oral Tube size: 7.0 mm Number of attempts: 1 Airway Equipment and Method: Fiberoptic brochoscope Placement Confirmation: ETT inserted through vocal cords under direct vision,  positive ETCO2 and breath sounds checked- equal and bilateral Secured at: 24 cm Tube secured with: Tape Dental Injury: Teeth and Oropharynx as per pre-operative assessment  Future Recommendations: Recommend- awake intubation Comments: Awake fiberoptic intubation with lidocaine local and precedex by Dr. Gelene Mink.  See anesthesiology note.  Carlynn Herald, CRNA

## 2012-09-26 NOTE — Transfer of Care (Signed)
Immediate Anesthesia Transfer of Care Note  Patient: [name]  Procedure(s) Performed: Procedure(s): Cervical six-seven Anterior cervical decompression/diskectomy/fusion (N/A)  Patient Location: PACU  Anesthesia Type:General  Level of Consciousness: awake, alert  and oriented  Airway & Oxygen Therapy: Patient connected to face mask  Post-op Assessment: Report given to PACU RN  Post vital signs: stable  Complications: No apparent anesthesia complications

## 2012-09-26 NOTE — Progress Notes (Signed)
Patient ID: [name], male   DOB: [birth year], 47 y.o.   MRN: [id number] Awake and alert. Motor function in right upper extremity is no different than it was preoperatively. Patient feels reasonably comfortable. Able to swallow and has eaten already. We'll mobilize and if possible discharge home tomorrow.

## 2012-09-27 ENCOUNTER — Encounter (HOSPITAL_COMMUNITY): Payer: Self-pay | Admitting: Neurological Surgery

## 2012-09-27 MED ORDER — DEXAMETHASONE SODIUM PHOSPHATE 4 MG/ML IJ SOLN
8.0000 mg | Freq: Once | INTRAMUSCULAR | Status: AC
  Administered 2012-09-27: 8 mg via INTRAVENOUS
  Filled 2012-09-27: qty 2

## 2012-09-27 MED ORDER — DEXAMETHASONE SODIUM PHOSPHATE 10 MG/ML IJ SOLN
6.0000 mg | INTRAMUSCULAR | Status: DC
  Filled 2012-09-27: qty 0.6

## 2012-09-27 MED ORDER — HYDROCODONE-ACETAMINOPHEN 5-325 MG PO TABS: 1.0000 | ORAL_TABLET | Freq: Four times a day (QID) | ORAL | Status: DC | PRN

## 2012-09-27 NOTE — Discharge Summary (Signed)
Physician Discharge Summary  Patient ID: ELIAKIM TENDLER MRN: [id number] DOB/AGE: May 31, 1965 47 y.o.  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 rate 18, SpO2 93.00%. incision clean and dry motor ok  Disposition: 01-Home or Self Care  Discharge Orders   Future Orders Complete By Expires     Call MD for:  redness, tenderness, or signs of infection (pain, swelling, redness, odor or green/yellow discharge around incision site)  As directed     Call MD for:  severe uncontrolled pain  As directed     Call MD for:  temperature >100.4  As directed     Diet - low sodium heart healthy  As directed     Discharge instructions  As directed     Comments:      Okay to shower. Do not apply salves or appointments to incision. No heavy lifting with the upper extremities greater than 15 pounds. May resume driving when not requiring pain medication and patient feels comfortable with doing so.    Increase activity slowly  As directed         Medication List         albuterol 1.25 MG/3ML nebulizer solution  Commonly known as:  ACCUNEB  Take 1 ampule by nebulization every 6 (six) hours as needed for wheezing.     albuterol 108 (90 BASE) MCG/ACT inhaler  Commonly known as:  VENTOLIN HFA  Inhale 2 puffs into the lungs every 4 (four) hours as needed for wheezing or shortness of breath.     beclomethasone 80 MCG/ACT inhaler  Commonly known as:  QVAR  INHALE 2 PUFFS INTO THE LUNGS 2 (TWO) TIMES DAILY. RINSE THE MOUTH AFTER USE     HYDROcodone-acetaminophen 5-325 MG per tablet  Commonly known as:  NORCO  Take 1 tablet by mouth every 6 (six) hours as needed for pain.     levETIRAcetam 500 MG tablet  Commonly known as:  KEPPRA  Take 1 tablet (500 mg total) by mouth every other day.     loperamide 2 MG capsule  Commonly known as:  IMODIUM  Take 4 mg by mouth daily.     TYLENOL ARTHRITIS PAIN 650 MG CR tablet  Generic drug:  acetaminophen  Take 650 mg by mouth daily as needed.         SignedStefani Dama 09/27/2012, 3:37 PM

## 2012-09-27 NOTE — Evaluation (Signed)
Occupational Therapy Evaluation Patient Details Name: [name] MRN: [id number] DOB: 02/25/1965 Today's Date: 09/27/2012 Time: 7829-5621 OT Time Calculation (min): 23 min  OT Assessment / Plan / Recommendation History of present illness  47-yo Rt handed male who had an injury at 81 old that left him with a period of anoxia. Subsequently, he has had difficulty with cerebral palsy like symptoms that involve significant weakness and disuse of his left upper extremity, weakness into his right lower extremity. Pt now s/p C6-7 ACDF. Pt has a wrist cock up splint for left UE that is worn during the day for functional task. Pt uses a w/c as his primary means of mobility.   Clinical Impression   Patient is s/p ACDF C6-7 surgery resulting in functional limitations due to the deficits listed below (see OT problem list).  Patient will benefit from skilled OT acutely to increase independence and safety with ADLS to allow discharge HHOT.     OT Assessment  Patient needs continued OT Services    Follow Up Recommendations  Home health OT    Barriers to Discharge      Equipment Recommendations  None recommended by OT    Recommendations for Other Services    Frequency  Min 2X/week    Precautions / Restrictions Precautions Precautions: Fall;Cervical Required Braces or Orthoses:  (no brace but pt would like brace for the bus rides)   Pertinent Vitals/Pain     ADL  Toilet Transfer: Moderate assistance Toilet Transfer Method: Sit to stand Toilet Transfer Equipment: Raised toilet seat with arms (or 3-in-1 over toilet) Toileting - Clothing Manipulation and Hygiene: Moderate assistance Where Assessed - Toileting Clothing Manipulation and Hygiene: Sit to stand from 3-in-1 or toilet Equipment Used: Gait belt Transfers/Ambulation Related to ADLs: pt hooked arms with therapist which pt calls "my method" to transfer from sit<>stand. pt stand pivoting to chair. Pt is able to complete  transfer at a level that family can (A) and get into power chair ADL Comments: pt will need (A) for LB dressing. Recommend HHOT to help address LB adls at home. Pt and family feel comfortable to d/c home. OT to follow acutely to help with strengthening for basic transfers and toilet transfers.    OT Diagnosis: Generalized weakness  OT Problem List: Decreased strength;Decreased activity tolerance;Impaired balance (sitting and/or standing);Decreased safety awareness;Decreased knowledge of use of DME or AE;Decreased knowledge of precautions OT Treatment Interventions: Self-care/ADL training;Neuromuscular education;Therapeutic exercise;DME and/or AE instruction;Therapeutic activities;Patient/family education;Balance training   OT Goals(Current goals can be found in the care plan section) Acute Rehab OT Goals Patient Stated Goal: to be able to pull his pants up without help OT Goal Formulation: With patient Time For Goal Achievement: 10/11/12 Potential to Achieve Goals: Good ADL Goals Additional ADL Goal #1: pt will complete bed moblity Min (A) as precursor to adls Additional ADL Goal #2: PT will complete basic transfer to chair or toilet with min (A)  Visit Information  Last OT Received On: 09/27/12 Assistance Needed: +1 PT/OT Co-Evaluation/Treatment: Yes History of Present Illness:  47-yo Rt handed male who had an injury at 11 old that left him with a period of anoxia. Subsequently, he has had difficulty with cerebral palsy like symptoms that involve significant weakness and disuse of his left upper extremity, weakness into his right lower extremity. Pt now s/p C6-7 ACDF. Pt has a wrist cock up splint for left UE that is worn during the day for functional task. Pt uses a w/c as  his primary means of mobility.       Prior Functioning     Home Living Family/patient expects to be discharged to:: Private residence Living Arrangements: Spouse/significant other Available Help at  Discharge: Family Additional Comments: See PT evaluation for full home setup Prior Function Level of Independence: Needs assistance;Independent with assistive device(s) ADL's / Homemaking Assistance Needed: Pt has required (A) for LB dressing and hygiene since new onset of changes since May 2014. Prior to May 2014 pt was independent Communication Communication: No difficulties Dominant Hand: Right         Vision/Perception Vision - History Visual History: Other (comment) (strabimus) Patient Visual Report: No change from baseline Vision - Assessment Eye Alignment: Impaired (comment) Vision Assessment: Vision not tested   Cognition  Cognition Arousal/Alertness: Awake/alert Behavior During Therapy: WFL for tasks assessed/performed Overall Cognitive Status: Within Functional Limits for tasks assessed    Extremity/Trunk Assessment Upper Extremity Assessment Upper Extremity Assessment: LUE deficits/detail LUE Deficits / Details: wrist drop and overlapping digits but able to extend digits and clinch into a 4 out 5 fist Lower Extremity Assessment Lower Extremity Assessment: Defer to PT evaluation Cervical / Trunk Assessment Cervical / Trunk Assessment: Other exceptions (ACDF s/p C 6-7 surg)     Mobility Bed Mobility Bed Mobility:  (see PT note) Transfers Transfers: Sit to Stand;Stand to Sit Sit to Stand: 3: Mod assist;With upper extremity assist;From bed Stand to Sit: 3: Mod assist;With upper extremity assist;To chair/3-in-1 Details for Transfer Assistance: pt demonstrated transfer first attempt pulling up from bed using back of recliner handles to simulate grab bar at home. pt returning to sitting due to fatigue from static standing. pt transfered to the right side into chair.     Exercise     Balance Balance Balance Assessed: Yes Static Standing Balance Static Standing - Balance Support: Bilateral upper extremity supported;During functional activity Static Standing -  Level of Assistance: 5: Stand by assistance   End of Session OT - End of Session Activity Tolerance: Patient tolerated treatment well Patient left: in chair;with call bell/phone within reach;with family/visitor present  GO     Lucile Shutters 09/27/2012, 12:45 PM Pager: [phone]

## 2012-09-27 NOTE — Progress Notes (Signed)
Utilization review completed.  

## 2012-09-27 NOTE — Evaluation (Signed)
Physical Therapy Evaluation Patient Details Name: [name] MRN: [id number] DOB: 1965/11/08 Today's Date: 09/27/2012 Time: 2952-8413 PT Time Calculation (min): 36 min  PT Assessment / Plan / Recommendation History of Present Illness   47-yo Rt handed male who had an injury at 59 old that left him with a period of anoxia. Subsequently, he has had difficulty with cerebral palsy like symptoms that involve significant weakness and disuse of his left upper extremity, weakness into his right lower extremity. Pt now s/p C6-7 ACDF. Pt has a wrist cock up splint for left UE that is worn during the day for functional task. Pt uses a w/c as his primary means of mobility.  Clinical Impression  Pt very motivated to return to PLOF.  Pt has good support from fiance and family.      PT Assessment  Patient needs continued PT services    Follow Up Recommendations  Home health PT;Supervision - Intermittent    Does the patient have the potential to tolerate intense rehabilitation      Barriers to Discharge        Equipment Recommendations  None recommended by PT    Recommendations for Other Services     Frequency Min 5X/week    Precautions / Restrictions Precautions Precautions: Fall;Cervical Required Braces or Orthoses:  (no brace but pt would like brace for the bus rides) Restrictions Weight Bearing Restrictions: No   Pertinent Vitals/Pain Indicates feeling tender and mild sore throat.        Mobility  Bed Mobility Bed Mobility: Left Sidelying to Sit;Sitting - Scoot to Edge of Bed Left Sidelying to Sit: 3: Mod assist;HOB flat Sitting - Scoot to Edge of Bed: 5: Supervision Details for Bed Mobility Assistance: A with bringing trunk up to sitting.  pt able to use trunk to scoot to EOB.   Transfers Transfers: Sit to Stand;Stand to Sit;Stand Pivot Transfers Sit to Stand: 3: Mod assist;With upper extremity assist;From bed Stand to Sit: 3: Mod assist;With upper extremity  assist;To chair/3-in-1 Stand Pivot Transfers: 3: Mod assist Details for Transfer Assistance: pt demonstrated transfer first attempt pulling up from bed using back of recliner handles to simulate grab bar at home. pt returning to sitting due to fatigue from static standing. pt transfered to the right side into chair.  pt holds onto OT's forearm to simulate pulling up on his bar at home for SPT.   Ambulation/Gait Ambulation/Gait Assistance: Not tested (comment) Stairs: No Wheelchair Mobility Wheelchair Mobility: No    Exercises     PT Diagnosis: Generalized weakness;Acute pain  PT Problem List: Decreased strength;Decreased activity tolerance;Decreased balance;Decreased mobility;Pain;Decreased knowledge of precautions PT Treatment Interventions: Functional mobility training;Therapeutic activities;Therapeutic exercise;Balance training;Patient/family education     PT Goals(Current goals can be found in the care plan section) Acute Rehab PT Goals Patient Stated Goal: to be able to pull his pants up without help PT Goal Formulation: With patient Time For Goal Achievement: 10/11/12 Potential to Achieve Goals: Good  Visit Information  Last PT Received On: 09/27/12 Assistance Needed: +1 PT/OT Co-Evaluation/Treatment: Yes History of Present Illness:  47-yo Rt handed male who had an injury at 89 old that left him with a period of anoxia. Subsequently, he has had difficulty with cerebral palsy like symptoms that involve significant weakness and disuse of his left upper extremity, weakness into his right lower extremity. Pt now s/p C6-7 ACDF. Pt has a wrist cock up splint for left UE that is worn during the day for functional  task. Pt uses a w/c as his primary means of mobility.       Prior Functioning  Home Living Family/patient expects to be discharged to:: Private residence Living Arrangements: Spouse/significant other Available Help at Discharge: Family Type of Home: House Home  Access: Level entry Home Layout: One level Home Equipment: Wheelchair - power Additional Comments: pt has rail attached to wall along side bed that he uses for transfers.   Prior Function Level of Independence: Needs assistance;Independent with assistive device(s) Gait / Transfers Assistance Needed: uses rail to pull up for transfers and to side step to power chair.   ADL's / Homemaking Assistance Needed: Pt has required (A) for LB dressing and hygiene since new onset of changes since May 2014. Prior to May 2014 pt was independent Communication Communication: No difficulties Dominant Hand: Right    Cognition  Cognition Arousal/Alertness: Awake/alert Behavior During Therapy: WFL for tasks assessed/performed Overall Cognitive Status: Within Functional Limits for tasks assessed    Extremity/Trunk Assessment Upper Extremity Assessment Upper Extremity Assessment: Defer to OT evaluation LUE Deficits / Details: wrist drop and overlapping digits but able to extend digits and clinch into a 4 out 5 fist Lower Extremity Assessment Lower Extremity Assessment: Generalized weakness Cervical / Trunk Assessment Cervical / Trunk Assessment: Other exceptions Cervical / Trunk Exceptions: pt s/p C6-7 ACDF and has had multiple cervical surgeries including Fusion from Occiput to C6.     Balance Balance Balance Assessed: Yes Static Standing Balance Static Standing - Balance Support: Bilateral upper extremity supported;During functional activity Static Standing - Level of Assistance: 5: Stand by assistance  End of Session PT - End of Session Equipment Utilized During Treatment: Gait belt Activity Tolerance: Patient tolerated treatment well Patient left: in chair;with call bell/phone within reach;with family/visitor present Nurse Communication: Mobility status  GP     Sunny Schlein, [place] [phone] 09/27/2012, 1:08 PM

## 2012-10-07 ENCOUNTER — Encounter: Payer: Self-pay | Admitting: Family Medicine

## 2012-10-07 ENCOUNTER — Ambulatory Visit (INDEPENDENT_AMBULATORY_CARE_PROVIDER_SITE_OTHER): Payer: Medicare Other | Admitting: Family Medicine

## 2012-10-07 VITALS — BP 132/90 | HR 102 | Temp 97.9°F | Wt 197.0 lb

## 2012-10-07 DIAGNOSIS — J069 Acute upper respiratory infection, unspecified: Secondary | ICD-10-CM

## 2012-10-07 DIAGNOSIS — J209 Acute bronchitis, unspecified: Secondary | ICD-10-CM | POA: Insufficient documentation

## 2012-10-07 MED ORDER — AMOXICILLIN-POT CLAVULANATE 875-125 MG PO TABS: 1.0000 | ORAL_TABLET | Freq: Two times a day (BID) | ORAL | Status: DC

## 2012-10-07 NOTE — Assessment & Plan Note (Addendum)
URI symptoms present 5 days, no fever, consolidation, or bloody or greenish sputum. Treat more aggressively, however, with recent surgery and CP with h/o pneumonia and lung collapse in distant past. - Reassured, expectant management. - If not improving significantly  In 2-3 days, augmentin 7 day course rx'ed - Encourage throat clearing - F/u in 1 week if not improving, return precautions reviewed

## 2012-10-07 NOTE — Progress Notes (Signed)
Patient ID: [name], male   DOB: Dec 25, 1965, 47 y.o.   MRN: [id number]  Subjective:   CC: Cough and congestion  HPI:   1. Cough and congestion - Pt has had 5 days of cough, sore throat (improving) and congestion. Cough occasionally brings up clear sputum. Denies fevers, chills, chest pain, dyspnea worse than normal. Uses albuterol and QVAR and has only needed albuterol once this week. Sick contact of fiancee. Had pneumonia and lung collapse years ago.  Had C6-C7 fused 1.5 weeks ago and worried about incision site.  Review of Systems - Per HPI.   PMH:  H/o pneumonia and lung collapse     Objective:  Physical Exam BP 132/90  Pulse 102  Temp(Src) 97.9 F (36.6 C) (Oral)  Wt 197 lb (89.359 kg)  BMI 29.08 kg/m2 GEN: NAD, pleasant HEENT: Atraumatic, normocephalic, neck supple, EOMI, sclera clear, TMs one clear and other occluded with wax so cannot visualize, o/p clear CV: RRR, no murmurs, rubs, or gallops ABD: Soft, nontender SKIN: No rash or cyanosis; warm and well-perfused EXTR: No lower extremity edema or calf tenderness; upper extremities contractured but able to move. 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 male with h/o CP, pneumonias, and recent surgery here for cough and congestion.    Plan:     # See problem list for problem-specific plans.

## 2012-10-07 NOTE — Patient Instructions (Addendum)
I think this is a viral infection and will clear in 3-5 days.  If you are not feeling any better by Sunday, go ahead and start augmentin twice daily for 7 days and come back to see [place] late next week. If you start feeling more sick, please seek immediate care here or at the ED.  Upper Respiratory Infection, Adult An upper respiratory infection (URI) is also known as the common cold. It is often caused by a type of germ (virus). Colds are easily spread (contagious). You can pass it to others by kissing, coughing, sneezing, or drinking out of the same glass. Usually, you get better in 1 or 2 weeks.  HOME CARE   Only take medicine as told by your doctor.  Use a warm mist humidifier or breathe in steam from a hot shower.  Drink enough water and fluids to keep your pee (urine) clear or pale yellow.  Get plenty of rest.  Return to work when your temperature is back to normal or as told by your doctor. You may use a face mask and wash your hands to stop your cold from spreading. GET HELP RIGHT AWAY IF:   After the first few days, you feel you are getting worse.  You have questions about your medicine.  You have chills, shortness of breath, or brown or red spit (mucus).  You have yellow or brown snot (nasal discharge) or pain in the face, especially when you bend forward.  You have a fever, puffy (swollen) neck, pain when you swallow, or white spots in the back of your throat.  You have a bad headache, ear pain, sinus pain, or chest pain.  You have a high-pitched whistling sound when you breathe in and out (wheezing).  You have a lasting cough or cough up blood.  You have sore muscles or a stiff neck. MAKE SURE YOU:   Understand these instructions.  Will watch your condition.  Will get help right away if you are not doing well or get worse. Document Released: 07/22/2007 Document Revised: 04/27/2011 Document Reviewed: 06/09/2010 [facility] Patient Information 2014 [place], [place].

## 2012-10-13 ENCOUNTER — Encounter: Payer: Self-pay | Admitting: Family Medicine

## 2012-10-13 ENCOUNTER — Ambulatory Visit (INDEPENDENT_AMBULATORY_CARE_PROVIDER_SITE_OTHER): Payer: Medicare Other | Admitting: Family Medicine

## 2012-10-13 VITALS — BP 118/85 | HR 93 | Temp 98.1°F

## 2012-10-13 DIAGNOSIS — M4712 Other spondylosis with myelopathy, cervical region: Secondary | ICD-10-CM

## 2012-10-13 DIAGNOSIS — J209 Acute bronchitis, unspecified: Secondary | ICD-10-CM

## 2012-10-13 NOTE — Assessment & Plan Note (Signed)
Pt's current wheelchair irritating leg and a wheel is nonrepairable.  - Wrote rx for new wheelchair and gave pt and mother contact info for [facility] - Pt to call and set up appt with PCP and [facility], per our clinic nurse manager - Call us with issues doing this

## 2012-10-13 NOTE — Patient Instructions (Addendum)
You look well today and your lungs sound clear. The bases like last time sound quieter. Continue using incentive spirometer and finish out your antibiotics. Follow up if symptoms return or if you get a fever. - Come for flu shot in October.  For your wheelchair, I will ask our nurse and send a message to Dr. Gayla Doss. I will also write a prescription for a new wheelchair and gave you information to call [facility]. Set up an appointment here with PCP and [facility]. If this does not work or they need something else, call and let me know.  Leona Singleton, MD

## 2012-10-13 NOTE — Assessment & Plan Note (Addendum)
Sx's were not improving so was more likely acute bronchitis in pt with difficulty clearing throat due to CP. Pt started augmentin 4 days ago and symptoms now mostly resolved with mild residual cough. Afebrile. - Finish course of augmentin - Continue incentive spirometry and good throat clearing - Flu shot October - Return precautions reviewed

## 2012-10-13 NOTE — Progress Notes (Signed)
Patient ID: MICA RELEFORD, male   DOB: [birth year], 47 y.o.   MRN: [id number] Subjective:   CC: Follow-up bronchitis  HPI:   1. URI - Pt is here to follow-up recent URI-type symptoms that did not improve with expectant management. Of note, pt has cerebral palsy and difficulty clearing throat, also recent c-spine surgery. Pt started augmentin 4 days ago due to no improvement clinically. Since then, breathing easier with no fevers or chills, no diarrhea or vomiting. Still mild occasional cough. Uses incentive spirometer at home.   No medication side effects.  Review of Systems - Per HPI.   PMH: No other med changes.    Objective:  Physical Exam BP 118/85  Pulse 93  Temp(Src) 98.1 F (36.7 C) (Oral) GEN: NAD, pleasant male with cerebral palsy HEENT: Atraumatic, normocephalic, sclera clear, lazy eye  CV: RRR, no murmurs, rubs, or gallops though distant PULM: poor air movement at bases; anteriorly and upper lobes posteriorly with good air movement, occasional dry cough MSK: In wheelchair SKIN: No rash or cyanosis; warm and well-perfused PSYCH: Mood and affect euthymic, normal rate and volume of speech NEURO: Awake, alert, mild dysarthria    Assessment:     CADE DASHNER is a 47 y.o. male here for follow-up of cough/cold symptoms that did not improve with conservative management.    Plan:     # See problem list for problem-specific plans.

## 2012-10-25 ENCOUNTER — Encounter: Payer: Self-pay | Admitting: Family Medicine

## 2012-11-24 ENCOUNTER — Ambulatory Visit: Payer: Medicare Other | Attending: Family Medicine | Admitting: Physical Therapy

## 2012-11-24 DIAGNOSIS — R269 Unspecified abnormalities of gait and mobility: Secondary | ICD-10-CM | POA: Insufficient documentation

## 2012-11-24 DIAGNOSIS — IMO0001 Reserved for inherently not codable concepts without codable children: Secondary | ICD-10-CM | POA: Insufficient documentation

## 2012-11-24 DIAGNOSIS — R293 Abnormal posture: Secondary | ICD-10-CM | POA: Insufficient documentation

## 2012-12-23 ENCOUNTER — Ambulatory Visit: Payer: Medicare Other | Admitting: Family Medicine

## 2012-12-29 ENCOUNTER — Other Ambulatory Visit: Payer: Self-pay | Admitting: Neurological Surgery

## 2012-12-29 DIAGNOSIS — M5000 Cervical disc disorder with myelopathy, unspecified cervical region: Secondary | ICD-10-CM

## 2013-01-03 ENCOUNTER — Ambulatory Visit (INDEPENDENT_AMBULATORY_CARE_PROVIDER_SITE_OTHER): Payer: Medicare Other | Admitting: Family Medicine

## 2013-01-03 ENCOUNTER — Encounter: Payer: Self-pay | Admitting: Family Medicine

## 2013-01-03 VITALS — BP 145/80 | HR 82 | Temp 97.9°F | Wt 193.0 lb

## 2013-01-03 DIAGNOSIS — R030 Elevated blood-pressure reading, without diagnosis of hypertension: Secondary | ICD-10-CM

## 2013-01-03 DIAGNOSIS — G809 Cerebral palsy, unspecified: Secondary | ICD-10-CM

## 2013-01-03 NOTE — Progress Notes (Signed)
Patient ID: [name], male   DOB: 1965-11-26, 47 y.o.   MRN: [id number]  Subjective  CC: CEREBRAL PALSY:Wheelchair referral  HPI: -The patient is here for completion of paperwork for a new wheelchair.  He has already had an assessment with the Neuro-rehabilitation clinic and needs forms completed by physician.  He recently had C6-C7 fusion on 8/11 for a herniated disc. 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 NB: Also here with the [facility] rehab sales specialist who presented paper work for completion in support of his wheel chair request. Denies any major concern today.  Elevated BP; Denies prior hx of HNT,not on medication, no neurologic, cardiac or pulmonary symptoms.  Past Medical History  Diagnosis Date  . Migraine   . Cerebral palsy   . Gait disorder   . Vitamin D deficiency   . Migraine   . Obesity   . Peripheral edema   . 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 Asthma     albuterol has to rarely usely  . Pneumonia     hx of  . Bronchitis     hx of  . Collapse, lung 2005    left   ROS: negative except per HPI  Objective  Filed Vitals:   01/03/13 1505 01/03/13 1522  BP: 153/100 145/80  Pulse: 82   Temp: 97.9 F (36.6 C)   TempSrc: Oral   Weight: 193 lb (87.544 kg)    Physical Exam: GEN: NAD, pleasant male with cerebral palsy  HEENT: Atraumatic, normocephalic, sclera clear, lazy eye  CV: RRR, no murmurs, rubs, or gallops though distant  PULM: Lungs clear MSK: In wheelchair  SKIN: No rash or cyanosis; warm and well-perfused  PSYCH: Mood and affect euthymic, normal rate and volume of speech  NEURO: Awake, alert, mild dysarthria   Assessment [name] is a 47 y.o. Male with history of cerebral palsy who is here for wheelchair referral forms.  Plan Wheelchair: -Will complete forms and send off to vendor, rehab specialist will then follow-up with patient  High blood pressure: -BPs elevated today,  but have been normal in the past -Will watch for now, but discussed with patient getting follow-up with PCP in 4 weeks -Patient will call back sooner for an appointment if it continues to be high  FMTS Attending  Note: Kehinde Eniola,MD I  have seen and examined this patient, reviewed their chart. I have discussed this patient with the medical student. I agree with the student's findings, assessment and care plan. Check problem list for my assessment and plan.

## 2013-01-03 NOTE — Patient Instructions (Addendum)
It was nice to see you today [name].  We are excited that we can help you in obtaining a newer, better-fitting wheelchair.  We have sent the paperwork to the wheelchair vendor, please contact us with any more    Your blood pressure was high today (150/100).  For now we will continue to watch this, but may consider options for treatment if it remains high.  If you are able to check your pressure at home, please do so.  If it continues to be high, please call back to set up an appointment.  Otherwise we would like to you to see your PCP, Dr. Wenda Low in about 4 weeks.  Thanks and have a great day!

## 2013-01-04 NOTE — Assessment & Plan Note (Signed)
Rechecked BP improved. Reduce salt in take. Instruction given to patient and his mother to follow up in 2-4 weeks for reassessment. May need to start on antihypertensive at that time if still elevated.

## 2013-01-04 NOTE — Assessment & Plan Note (Addendum)
Patient with congenital cerebral palsy and recent decompression surgery for cervical spondylosis and herniated disc needing wheel chair assessment. He has already been assessed at the neuro rehab center. All PT completed assessment paper work reviewed. Patient would benefit from powered wheel chair. Due to inability to move himself,tilt and shift wheel chair is recommended for pressure relieve to prevent decubitus ulcer. Patient seem physically and mentally capable of operating power wheeled chair. Paper work completed.

## 2013-01-05 ENCOUNTER — Telehealth: Payer: Self-pay | Admitting: Family Medicine

## 2013-01-05 NOTE — Telephone Encounter (Signed)
Spoke with Eunice Blase and note was faxed to her. Donnetta Gillin,CMA

## 2013-01-05 NOTE — Telephone Encounter (Signed)
Debbie from [facility] called and needed the notes from [name]'s visit that she attended on 11/18. She would like someone to call her and let her know when she can get this. jw

## 2013-01-13 ENCOUNTER — Ambulatory Visit
Admission: RE | Admit: 2013-01-13 | Discharge: 2013-01-13 | Disposition: A | Discharge status: Home / Self Care | Payer: Medicare Other | Source: Ambulatory Visit | Attending: Neurological Surgery | Admitting: Neurological Surgery

## 2013-01-13 DIAGNOSIS — M5000 Cervical disc disorder with myelopathy, unspecified cervical region: Secondary | ICD-10-CM

## 2013-03-03 ENCOUNTER — Encounter: Payer: Self-pay | Admitting: Family Medicine

## 2013-03-03 ENCOUNTER — Ambulatory Visit (INDEPENDENT_AMBULATORY_CARE_PROVIDER_SITE_OTHER): Payer: Medicare Other | Admitting: Family Medicine

## 2013-03-03 VITALS — BP 125/88 | HR 81 | Temp 97.6°F

## 2013-03-03 DIAGNOSIS — M4712 Other spondylosis with myelopathy, cervical region: Secondary | ICD-10-CM

## 2013-03-03 DIAGNOSIS — J45909 Unspecified asthma, uncomplicated: Secondary | ICD-10-CM

## 2013-03-03 DIAGNOSIS — M7989 Other specified soft tissue disorders: Secondary | ICD-10-CM

## 2013-03-03 DIAGNOSIS — R600 Localized edema: Secondary | ICD-10-CM

## 2013-03-03 DIAGNOSIS — G809 Cerebral palsy, unspecified: Secondary | ICD-10-CM

## 2013-03-03 DIAGNOSIS — J4520 Mild intermittent asthma, uncomplicated: Secondary | ICD-10-CM

## 2013-03-03 DIAGNOSIS — R609 Edema, unspecified: Secondary | ICD-10-CM

## 2013-03-03 MED ORDER — LEVETIRACETAM 500 MG PO TABS: 500.0000 mg | ORAL_TABLET | ORAL | Status: DC

## 2013-03-03 NOTE — Assessment & Plan Note (Signed)
Reports he has been of Qvar since his "lung operation where part of is lung was removed" but does notice some wheezing especially around smokers and during the winter - he is curious if he still needs this medication - Advised him to continue during this flu season and we could do a trial off in the spring if desired

## 2013-03-03 NOTE — Progress Notes (Signed)
Subjective:     Patient ID: [name], male   DOB: 05-20-1965, 48 y.o.   MRN: [id number]  HPI Comments: Micheal GensJeffrey complains for Right leg swelling that has been intermittent for > 107yrs.  Denies any pain or redness with swelling. Has noticed some improvement with use of different wheelchair and soaking it in hot tub. Denies any hx of trauma of blood clots. Has had multiple surgeries on his right leg and hip. Denies and cp or sob. It does not particularly bother him, but his mother joins him today and wants to him to be evaluated. She reports that the swelling is decreased significantly from yesterday w/o any treatments.     Review of Systems  Constitutional: Positive for chills. Negative for fever, activity change and fatigue.  Respiratory: Negative for cough, chest tightness and shortness of breath.   Cardiovascular: Positive for palpitations and leg swelling. Negative for chest pain.       Objective:   Physical Exam  Cardiovascular: Normal rate and regular rhythm.   Pulmonary/Chest: Effort normal. He has no wheezes. He has no rales.  Decreased breath sounds on left;   Musculoskeletal:  1+ pitting edema in Right LE to mid shin, No calf erythema or tenderness or pain w/ dorsiflexion; No edema in left ankle/foot.     Assessment/Plan:      See Problem Focused Assessment & Plan

## 2013-03-03 NOTE — Assessment & Plan Note (Signed)
Pertinent S&O  Intermittent Rt LE edema > 2 years; No pain, erythema, No hx of clots  He associates it with wheelchair use/position - New wheelchair recently ordered   He report multiple surgeries on Rt thigh and hip (muscle releases) Assessment  Has had recent LE dopplers neg for DVT  1+ pitting edema to mid shin on Rt leg only; Pulses 1+; Rt foot slightly cooler than Left Plan  Check CMET today for liver and renal fcn  Waxing and waning nature; daily variation without medical treatment: likely related to wheelchair use and positioning; but possible complicated by previous rt leg surgies and potential vascular damage  Consider ABI in future  No evidence of Heart failure, but consider ECHO in future

## 2013-03-03 NOTE — Patient Instructions (Signed)
It was great seeing you today.   1. We will check some blood work today and let you know about the results 2. I will speak our pharmacist to see if additional test are needed.    Please bring all your medications to every doctors visit  Sign up for My Chart to have easy access to your labs results, and communication with your Primary care physician.  Next Appointment  6 months   I look forward to talking with you again at our next visit. If you have any questions or concerns before then, please call the clinic at [phone].  Take Care,   Dr Wenda LowJames Stefhanie Kachmar

## 2013-03-04 LAB — COMPREHENSIVE METABOLIC PANEL
ALBUMIN: 4.4 g/dL (ref 3.5–5.2)
ALT: 16 U/L (ref 0–53)
AST: 19 U/L (ref 0–37)
Alkaline Phosphatase: 108 U/L (ref 39–117)
BUN: 11 mg/dL (ref 6–23)
CALCIUM: 9.6 mg/dL (ref 8.4–10.5)
CO2: 29 meq/L (ref 19–32)
Chloride: 101 mEq/L (ref 96–112)
Creat: 0.62 mg/dL (ref 0.50–1.35)
GLUCOSE: 87 mg/dL (ref 70–99)
POTASSIUM: 3.8 meq/L (ref 3.5–5.3)
SODIUM: 141 meq/L (ref 135–145)
TOTAL PROTEIN: 6.5 g/dL (ref 6.0–8.3)
Total Bilirubin: 2.2 mg/dL — ABNORMAL HIGH (ref 0.3–1.2)

## 2013-03-05 ENCOUNTER — Encounter: Payer: Self-pay | Admitting: Family Medicine

## 2013-05-19 ENCOUNTER — Other Ambulatory Visit: Payer: Self-pay | Admitting: Family Medicine

## 2013-05-22 ENCOUNTER — Other Ambulatory Visit: Payer: Self-pay | Admitting: Family Medicine

## 2013-05-22 ENCOUNTER — Telehealth: Payer: Self-pay | Admitting: Family Medicine

## 2013-05-22 MED ORDER — BECLOMETHASONE DIPROPIONATE 80 MCG/ACT IN AERS: 2.0000 | INHALATION_SPRAY | Freq: Two times a day (BID) | RESPIRATORY_TRACT | Status: DC

## 2013-05-22 NOTE — Telephone Encounter (Signed)
Mother called because her son needs a refill on his QVAR called in. jw

## 2013-07-21 ENCOUNTER — Other Ambulatory Visit: Payer: Self-pay | Admitting: Family Medicine

## 2013-07-28 NOTE — Telephone Encounter (Signed)
Please call have him make an appointment before next refill can be given in 2 month as blood work is needed. Please also inquire if he continuous to see a Neurologist?

## 2013-07-28 NOTE — Telephone Encounter (Signed)
Left voice message informing pt the refill was done for Keppra and he needed to schedule an appt for next refills and lab work.  Clovis PuMartin, Keshonna Valvo L, RN

## 2013-08-25 ENCOUNTER — Ambulatory Visit (INDEPENDENT_AMBULATORY_CARE_PROVIDER_SITE_OTHER): Payer: Medicare Other | Admitting: Family Medicine

## 2013-08-25 ENCOUNTER — Encounter: Payer: Self-pay | Admitting: Family Medicine

## 2013-08-25 VITALS — BP 138/95 | HR 79 | Temp 97.5°F | Wt 213.4 lb

## 2013-08-25 DIAGNOSIS — J4520 Mild intermittent asthma, uncomplicated: Secondary | ICD-10-CM

## 2013-08-25 DIAGNOSIS — R600 Localized edema: Secondary | ICD-10-CM

## 2013-08-25 DIAGNOSIS — R569 Unspecified convulsions: Secondary | ICD-10-CM

## 2013-08-25 DIAGNOSIS — R609 Edema, unspecified: Secondary | ICD-10-CM

## 2013-08-25 DIAGNOSIS — Z8669 Personal history of other diseases of the nervous system and sense organs: Secondary | ICD-10-CM

## 2013-08-25 DIAGNOSIS — I10 Essential (primary) hypertension: Secondary | ICD-10-CM

## 2013-08-25 DIAGNOSIS — J45909 Unspecified asthma, uncomplicated: Secondary | ICD-10-CM

## 2013-08-25 DIAGNOSIS — G43909 Migraine, unspecified, not intractable, without status migrainosus: Secondary | ICD-10-CM

## 2013-08-25 MED ORDER — BECLOMETHASONE DIPROPIONATE 80 MCG/ACT IN AERS: 2.0000 | INHALATION_SPRAY | Freq: Two times a day (BID) | RESPIRATORY_TRACT | Status: DC

## 2013-08-25 MED ORDER — SUMATRIPTAN SUCCINATE 6 MG/0.5ML ~~LOC~~ SOLN
6.0000 mg | Freq: Once | SUBCUTANEOUS | Status: AC
  Administered 2013-08-25: 6 mg via SUBCUTANEOUS

## 2013-08-25 MED ORDER — LEVETIRACETAM 500 MG PO TABS: 500.0000 mg | ORAL_TABLET | ORAL | Status: DC

## 2013-08-25 MED ORDER — SUMATRIPTAN SUCCINATE 6 MG/0.5ML ~~LOC~~ SOLN: 6.0000 mg | Freq: Once | SUBCUTANEOUS | Status: DC

## 2013-08-25 NOTE — Assessment & Plan Note (Signed)
BP elevated today, but currently having a migraine - Re-evaluate at next visit and consider HTN treatment at that time if still elevated

## 2013-08-25 NOTE — Progress Notes (Signed)
Subjective:     Patient ID: [name], male   DOB: 1965-05-21, 48 y.o.   MRN: [id number]  HPI Comments: [name] comes in today for migraine and refills for Keppra.   Migraine - He reports ~ 2 mild HA a weeks, but gets Migraines associated with photophobia and nausea ~ once a month. He notes that his migraine triggers are smoke. He take tylenol and ibuprofen occasionally for his mild HAs and comes to clinic for "shots" which have alleviated his migraines in the past He is unsure of what "shots" he has received in the past. His migraine started this morning and has been getting worse. He denies any vision changes or fevers.   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 He has been seen by Neurologist in the past who started Keppra,but he no long requires follow-up with Neurology. He continues to take Keppra every other day without any side-effects.   Right Leg swelling - He continue to have right leg swelling that is worse with sitting in his wheelchair and get better with foot elevation. He did get a new wheelchair but he continues to have swelling. He denies any CP, SOB.     Review of Systems  Eyes: Positive for photophobia.  Respiratory: Negative for chest tightness and shortness of breath.   Cardiovascular: Positive for leg swelling. Negative for chest pain and palpitations.  Gastrointestinal: Negative for abdominal pain and abdominal distention.  Musculoskeletal: Positive for myalgias.       Objective:   Physical Exam  Cardiovascular: Normal rate and regular rhythm.   No murmur heard. Right leg pitting edema 1+  Pulmonary/Chest: Effort normal and breath sounds normal. No respiratory distress.  Abdominal: Soft. He exhibits no distension and no mass. There is no tenderness.    Assessment/Plan:      See Problem Focused Assessment & Plan

## 2013-08-25 NOTE — Assessment & Plan Note (Signed)
Well controlled: hasn't used albuterol in > 1 year Refilled Qvar

## 2013-08-25 NOTE — Assessment & Plan Note (Signed)
Unilateral Right leg edema likely secondary to previous muscle release surgery and wheelchair positioning - Discuss elevating his legs twice a day ( which he says he can do ) and reassess at next visit - No current signs/symptoms of HF, anemia CKD or Liver failure

## 2013-08-25 NOTE — Patient Instructions (Signed)
It was great seeing you today.   1. I have refilled your Keppra and check some blood work today and call you if anything is abnormal 2. Next time you have a Migraine try taking tylenol or Excedrin with a  Benadryl and see if that stops your Migraine. If that doesn't work call the clinic for an apt    Please bring all your medications to every doctors visit  Sign up for My Chart to have easy access to your labs results, and communication with your Primary care physician.  Next Appointment  Please call to make an appointment with Dr Gayla DossJoyner in 6 months   I look forward to talking with you again at our next visit. If you have any questions or concerns before then, please call the clinic at [phone].  Take Care,   Dr Wenda LowJames Caellum Mancil

## 2013-08-25 NOTE — Assessment & Plan Note (Signed)
Background HAs ~ 2 x weekly with monthly migraine trigger by smoke - He is not interested in preventive therapy at this time - Discuss treating Migraines quickly with Tylenol/Ibuprofen and benadryl at home  - Given Imitrex IM 6mg  today; if tylenol/ibuprofen and benadryl fail to treat next migraine will consider prescribing Imitrex for home use

## 2013-08-26 LAB — CBC
HCT: 41.4 % (ref 39.0–52.0)
Hemoglobin: 14.7 g/dL (ref 13.0–17.0)
MCH: 30 pg (ref 26.0–34.0)
MCHC: 35.5 g/dL (ref 30.0–36.0)
MCV: 84.5 fL (ref 78.0–100.0)
PLATELETS: 239 10*3/uL (ref 150–400)
RBC: 4.9 MIL/uL (ref 4.22–5.81)
RDW: 14 % (ref 11.5–15.5)
WBC: 7.2 10*3/uL (ref 4.0–10.5)

## 2013-10-13 ENCOUNTER — Other Ambulatory Visit: Payer: Self-pay | Admitting: Family Medicine

## 2014-04-23 ENCOUNTER — Other Ambulatory Visit: Payer: Self-pay | Admitting: *Deleted

## 2014-04-23 DIAGNOSIS — J4520 Mild intermittent asthma, uncomplicated: Secondary | ICD-10-CM

## 2014-04-24 MED ORDER — BECLOMETHASONE DIPROPIONATE 80 MCG/ACT IN AERS: 2.0000 | INHALATION_SPRAY | Freq: Two times a day (BID) | RESPIRATORY_TRACT | Status: DC

## 2014-05-24 ENCOUNTER — Telehealth: Payer: Self-pay | Admitting: Family Medicine

## 2014-05-24 ENCOUNTER — Ambulatory Visit (INDEPENDENT_AMBULATORY_CARE_PROVIDER_SITE_OTHER): Payer: Medicare Other | Admitting: Family Medicine

## 2014-05-24 ENCOUNTER — Ambulatory Visit
Admission: RE | Admit: 2014-05-24 | Discharge: 2014-05-24 | Disposition: A | Discharge status: Home / Self Care | Payer: Medicare Other | Source: Ambulatory Visit | Attending: Family Medicine | Admitting: Family Medicine

## 2014-05-24 ENCOUNTER — Encounter: Payer: Self-pay | Admitting: Family Medicine

## 2014-05-24 VITALS — BP 112/80 | HR 113 | Wt 211.0 lb

## 2014-05-24 DIAGNOSIS — J189 Pneumonia, unspecified organism: Secondary | ICD-10-CM

## 2014-05-24 LAB — BASIC METABOLIC PANEL
BUN: 6 mg/dL (ref 6–23)
CHLORIDE: 97 meq/L (ref 96–112)
CO2: 26 meq/L (ref 19–32)
Calcium: 9.1 mg/dL (ref 8.4–10.5)
Creat: 0.66 mg/dL (ref 0.50–1.35)
GLUCOSE: 91 mg/dL (ref 70–99)
POTASSIUM: 3.6 meq/L (ref 3.5–5.3)
Sodium: 138 mEq/L (ref 135–145)

## 2014-05-24 MED ORDER — AZITHROMYCIN 500 MG PO TABS
ORAL_TABLET | ORAL | Status: DC
Start: 2014-05-24 — End: 2015-04-01

## 2014-05-24 MED ORDER — BENZONATATE 100 MG PO CAPS: 200.0000 mg | ORAL_CAPSULE | Freq: Three times a day (TID) | ORAL | Status: DC | PRN

## 2014-05-24 MED ORDER — ALBUTEROL SULFATE 1.25 MG/3ML IN NEBU: 1.0000 | INHALATION_SOLUTION | Freq: Four times a day (QID) | RESPIRATORY_TRACT | Status: DC | PRN

## 2014-05-24 NOTE — Telephone Encounter (Signed)
Called pt with x-ray results showing probable RLL PNA.  He is doing well, denies new Sx, started the azithro for CAP.  Scheduled appointment for him to be seen early next week but to call if anything changes prior to then or if Sx become worse, to call 911/go to emergency room.  He understands and has no further questions.  As well, a CT should be considered at f/u due to findings of this hilar LAD along with resolution of the PNA by this time.  Will forward to provider who will see him next week.  Thanks Tesoro CorporationBryan R. Paulina FusiHess, DO of [facility] 05/24/2014, 2:46 PM

## 2014-05-24 NOTE — Telephone Encounter (Signed)
Will see him next week.   CGM

## 2014-05-24 NOTE — Progress Notes (Signed)
  Subjective:     [name] is a 49 y.o. male here for evaluation of a cough. Onset of symptoms was 5 days ago. Symptoms have been gradually worsening since that time. The cough is productive of yellow sputum and is aggravated by nothing. Associated symptoms include: fever and wheezing. Patient does have a history of asthma. Patient does not have a history of environmental allergens. Patient has not traveled recently. Patient does not have a history of smoking. Patient has not had a previous chest x-ray. Does have CP with hx of PNA due to decreased TLC due to her contractures.  Denies recent travel or sick contacts  The following portions of the patient's history were reviewed and updated as appropriate: allergies, current medications, past family history, past medical history, past social history, past surgical history and problem list.  Review of Systems Pertinent items are noted in HPI.    Objective:    Oxygen saturation 90% on room air BP 112/80 mmHg  Pulse 113  Wt 211 lb (95.709 kg)  SpO2 90%  Gen: NAD, comfortable in wheelchair  Neck: no adenopathy Lungs: rhonchi RLL and RML and no increased WOB, no accessory muscle use Heart: tachycardic, normal rhythm, no murmurs appreciated     Assessment:    Pneumonia    Plan:    Antibiotics per medication orders. Antitussives per medication orders. Avoid exposure to tobacco smoke and fumes. B-agonist inhaler. Call if shortness of breath worsens, blood in sputum, change in character of cough, development of fever or chills, inability to maintain nutrition and hydration. Avoid exposure to tobacco smoke and fumes. Chest x-ray.   CURB 65 of 0.  Pt tachycardic but not tachypneic on exam.  Slightly hypoxic on exam as well but normal respiratory effort. CXR to evaluate and start azithromycin.  No RF for HCAP at this time

## 2014-05-24 NOTE — Patient Instructions (Signed)
Please get your chest x-ray, pick up the azithromycin.  If you worsen, please go to the emergency room.  Thanks, Dr. Paulina FusiHess

## 2014-05-25 LAB — CBC WITH DIFFERENTIAL/PLATELET
BASOS PCT: 0 % (ref 0–1)
Basophils Absolute: 0 10*3/uL (ref 0.0–0.1)
EOS ABS: 0 10*3/uL (ref 0.0–0.7)
Eosinophils Relative: 0 % (ref 0–5)
HEMATOCRIT: 42.3 % (ref 39.0–52.0)
Hemoglobin: 14.9 g/dL (ref 13.0–17.0)
LYMPHS ABS: 0.9 10*3/uL (ref 0.7–4.0)
Lymphocytes Relative: 10 % — ABNORMAL LOW (ref 12–46)
MCH: 29.8 pg (ref 26.0–34.0)
MCHC: 35.2 g/dL (ref 30.0–36.0)
MCV: 84.6 fL (ref 78.0–100.0)
MONO ABS: 0.5 10*3/uL (ref 0.1–1.0)
MONOS PCT: 6 % (ref 3–12)
MPV: 9.9 fL (ref 8.6–12.4)
Neutro Abs: 7.6 10*3/uL (ref 1.7–7.7)
Neutrophils Relative %: 84 % — ABNORMAL HIGH (ref 43–77)
Platelets: 277 10*3/uL (ref 150–400)
RBC: 5 MIL/uL (ref 4.22–5.81)
RDW: 13.3 % (ref 11.5–15.5)
WBC: 9.1 10*3/uL (ref 4.0–10.5)

## 2014-05-30 ENCOUNTER — Ambulatory Visit (INDEPENDENT_AMBULATORY_CARE_PROVIDER_SITE_OTHER): Payer: Medicare Other | Admitting: Family Medicine

## 2014-05-30 ENCOUNTER — Encounter: Payer: Self-pay | Admitting: Family Medicine

## 2014-05-30 VITALS — BP 125/90 | HR 74 | Temp 98.2°F | Wt 209.0 lb

## 2014-05-30 DIAGNOSIS — J189 Pneumonia, unspecified organism: Secondary | ICD-10-CM

## 2014-05-30 DIAGNOSIS — R918 Other nonspecific abnormal finding of lung field: Secondary | ICD-10-CM

## 2014-05-30 DIAGNOSIS — R222 Localized swelling, mass and lump, trunk: Secondary | ICD-10-CM | POA: Diagnosis not present

## 2014-05-30 NOTE — Patient Instructions (Addendum)
Thanks for coming in today.   I expect your cough to continue to resolve.   If you develop fever, worsening breathing, or other worsening respiratory symptoms then please return for evaluation.   You will get a CT of your chest at 8:30 tomorrow.   Thanks for letting us take care of you.   Sincerely,  Devota Pacealeb Avaleen Brownley, MD  Family Medicine - PGY 1

## 2014-05-30 NOTE — Progress Notes (Signed)
Patient ID: [name], male   DOB: [birth year], 49 y.o.   MRN: [id number]   [facility] Yolande Jollyaleb G Niccolas Loeper, MD Phone: [phone]  Subjective:   # Follow Up for CAP  - Pt. With improvement of cough, and pulmonary symptoms on antibiotics.  - He says that he only has a mild cough now.  - No fevers, no chills, no nausea / vomiting.  - He feels "100% better".   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 Mass hard to tell due to rotation.  - No weight loss, decreased appetite, bruising.  - Could be related to pneumonia vs. Another process.  - Pt. Made aware.   All relevant systems were reviewed and were negative unless otherwise noted in the HPI  Past Medical History Reviewed problem list.  Medications- reviewed and updated Current Outpatient Prescriptions  Medication Sig Dispense Refill  . acetaminophen (TYLENOL ARTHRITIS PAIN) 650 MG CR tablet Take 650 mg by mouth daily as needed.     Marland Kitchen. albuterol (ACCUNEB) 1.25 MG/3ML nebulizer solution Take 3 mLs (1.25 mg total) by nebulization every 6 (six) hours as needed for wheezing. 75 mL 3  . azithromycin (ZITHROMAX) 500 MG tablet 2 tab x 1 day, then 1 tab per day thereafter 6 tablet 0  . beclomethasone (QVAR) 80 MCG/ACT inhaler Inhale 2 puffs into the lungs 2 (two) times daily. 8.7 g 5  . benzonatate (TESSALON) 100 MG capsule Take 2 capsules (200 mg total) by mouth 3 (three) times daily as needed for cough. 30 capsule 0  . levETIRAcetam (KEPPRA) 500 MG tablet Take 1 tablet (500 mg total) by mouth every other day. 30 tablet 5  . loperamide (IMODIUM) 2 MG capsule Take 4 mg by mouth daily.    . VENTOLIN HFA 108 (90 BASE) MCG/ACT inhaler INHALE 2 PUFFS INTO THE LUNGS EVERY 4 (FOUR) HOURS AS NEEDED FOR WHEEZING OR SHORTNESS OF BREATH. 18 g 11   No current facility-administered medications for this visit.   Chief complaint-noted No additions to  family history Social history- patient is a non smoker  Objective: BP 125/90 mmHg  Pulse 74  Temp(Src) 98.2 F (36.8 C) (Oral)  Wt 209 lb (94.802 kg) Gen: NAD, alert, cooperative with exam HEENT: NCAT, EOMI, PERRL, No LAD, MMM Neck: FROM, supple, no thyromegaly. Axilla and suprclavicular areas / clavicular area without LAD.  CV: RRR, good S1/S2, no murmur Resp: Decreased breath sounds on the left (apparently chronic 2/2 removed area of left lower lobe), no wheezes, non-labored, no crackles, good air movement throughout upper lobe of left and throughout right lung.  Abd: SNTND, BS present, no guarding or organomegaly Ext: No edema, warm, normal tone, moves UE/LE spontaneously Neuro: Alert and oriented, No gross deficits Skin: no rashes no lesions  Assessment/Plan:  Community Acquired Pneumonia -  Resolving. Clinically improved.   - Continue tessalon perles prn.  - Drink plenty of water. - Return precautions given.   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 No warning signs clinically at this point.   - F/U CT with contrast recommended - Scheduled CT for 4/14 at 8:30 am.  - Will f/u result.

## 2014-05-31 ENCOUNTER — Ambulatory Visit (HOSPITAL_COMMUNITY)
Admission: RE | Admit: 2014-05-31 | Discharge: 2014-05-31 | Disposition: A | Discharge status: Home / Self Care | Payer: Medicare Other | Source: Ambulatory Visit | Attending: Family Medicine | Admitting: Family Medicine

## 2014-05-31 DIAGNOSIS — R05 Cough: Secondary | ICD-10-CM | POA: Insufficient documentation

## 2014-05-31 DIAGNOSIS — R222 Localized swelling, mass and lump, trunk: Secondary | ICD-10-CM | POA: Insufficient documentation

## 2014-05-31 DIAGNOSIS — R918 Other nonspecific abnormal finding of lung field: Secondary | ICD-10-CM

## 2014-05-31 MED ORDER — IOHEXOL 300 MG/ML  SOLN
100.0000 mL | Freq: Once | INTRAMUSCULAR | Status: AC | PRN
  Administered 2014-05-31: 60 mL via INTRAVENOUS

## 2014-06-01 ENCOUNTER — Telehealth: Payer: Self-pay | Admitting: Family Medicine

## 2014-06-01 NOTE — Telephone Encounter (Signed)
Will forward to MD covering for PCP for review. Hagar Sadiq, CMA. 

## 2014-06-01 NOTE — Telephone Encounter (Signed)
Want to be informed of results of Mr. Henriette CombsJohnson's Ct scan

## 2014-06-01 NOTE — Telephone Encounter (Signed)
Reviewed chart and CT results. Called and informed mother of CT being negative for hilar mass seen on CXR and the reason it was done, but that it did show a large hiatal hernia. Discussed that hiatal hernia is a common finding, likely living with this for a long time, no urgent need to refer at this time, but could be an explanation of his GERD and stomach issues that mother does say he has. Suggested she call and schedule a visit with Dr. Gayla DossJoyner to decide whether or not to refer to general surgery/GI, but that it would be my best opinion that they would not do any surgery as long as he can tolerate his symptoms. -Dr. Waynetta SandyWight

## 2014-06-01 NOTE — Telephone Encounter (Signed)
Entered in error.   CGM MD 

## 2014-06-12 ENCOUNTER — Encounter: Payer: Self-pay | Admitting: Family Medicine

## 2014-06-12 NOTE — Progress Notes (Signed)
Mother dropped form off to be completed for the patient's handicap placard. Please contact the pt's mother, Jamesetta Sohyllis, at [phone] when ready for pick up.  Thanks HoneywellSadie Reynolds, ASA

## 2014-06-13 NOTE — Progress Notes (Signed)
Left voice message for pt's mom informing her that form is complete and ready for pickup.  Clovis PuMartin, Tamika L, RN

## 2014-06-13 NOTE — Progress Notes (Signed)
Clinic staff portion completed and given to MD (in clinic today). Nicolasa Milbrath, Maryjo RochesterJessica Dawn

## 2014-11-08 ENCOUNTER — Other Ambulatory Visit: Payer: Self-pay | Admitting: Family Medicine

## 2014-11-08 NOTE — Telephone Encounter (Signed)
Refill request from pharmacy. Will forward to PCP for review. Adams,Latoya, CMA. 

## 2015-03-26 ENCOUNTER — Telehealth: Payer: Self-pay | Admitting: *Deleted

## 2015-03-26 NOTE — Telephone Encounter (Signed)
Prior Authorization received from [employer] for Qvar.  PA form placed in provider box for completion. Clovis Pu, RN

## 2015-03-27 NOTE — Telephone Encounter (Signed)
PA form faxed to [facility] for review.  Review process could take 24-72 hours to complete.  Clovis Pu, RN

## 2015-03-27 NOTE — Telephone Encounter (Signed)
PA for Qvar 80 mcg was denied via [employer]. Formulary alternatives are Budesonide Sus 0.25 mg/2 ML, 0.5 mg/2 mL or 1 mg /2 mL; Arnuity Elpt Inh 100 mcg or 200 mcg; Flovent Dis AER 20 mcg, 100 mcg or 250 mcg or Flovent HFA AER 44 mcg, 110 mcg or 220 mcg.  Clovis Pu, RN

## 2015-03-29 ENCOUNTER — Other Ambulatory Visit: Payer: Self-pay | Admitting: Family Medicine

## 2015-03-29 ENCOUNTER — Telehealth: Payer: Self-pay | Admitting: Family Medicine

## 2015-03-29 DIAGNOSIS — J4520 Mild intermittent asthma, uncomplicated: Secondary | ICD-10-CM

## 2015-03-29 MED ORDER — FLUTICASONE PROPIONATE HFA 110 MCG/ACT IN AERO: 2.0000 | INHALATION_SPRAY | Freq: Two times a day (BID) | RESPIRATORY_TRACT | Status: DC

## 2015-03-29 NOTE — Telephone Encounter (Signed)
Talk to his mother and she says he has been doing well with Qvar 2 puffs twice a day. Previously tolerated Flovent well.  - Discontinue Qvar due to insurance formulary change - Start Flovent 110 mcg, 2 puffs, twice a day

## 2015-03-29 NOTE — Telephone Encounter (Signed)
Mother called because Q-Var was denied by his insurance but there are alternatives. She would like any of the alternates called in ASAP for her to the [facility] on [address]. jw

## 2015-03-29 NOTE — Telephone Encounter (Signed)
Called and left VM for mother to call back to discuss medication change. Please find out the best # and time to call her if she calls.

## 2015-03-29 NOTE — Telephone Encounter (Signed)
Will forward to MD to make him aware. Tavari Loadholt,CMA  

## 2015-04-01 ENCOUNTER — Ambulatory Visit (INDEPENDENT_AMBULATORY_CARE_PROVIDER_SITE_OTHER): Payer: Medicare Other | Admitting: Family Medicine

## 2015-04-01 VITALS — BP 118/75 | HR 82 | Temp 98.4°F | Wt 209.0 lb

## 2015-04-01 DIAGNOSIS — R197 Diarrhea, unspecified: Secondary | ICD-10-CM | POA: Insufficient documentation

## 2015-04-01 NOTE — Patient Instructions (Signed)
It was great seeing you today.  Your diarrhea and abdominal pain is most likely related to a viral gastroenteritis.  I will check some blood work today to make sure that your electrolytes, red blood cells are normal  1. He can take loperamide up to 16 mg a day.  Take 1 pill after every bowel movement, up to 8 pills a day.  2. Continued to drink plenty of water.  Mix regular Gatorade with water if you want to continue to drink this.    Please bring all your medications to every doctors visit  Sign up for My Chart to have easy access to your labs results, and communication with your Primary care physician.  Next Appointment  Please call to make an appointment with Dr Gayla Doss in 2-3 days if not improving   I look forward to talking with you again at our next visit. If you have any questions or concerns before then, please call the clinic at [phone].  Take Care,   Dr Wenda Low

## 2015-04-01 NOTE — Assessment & Plan Note (Signed)
1-2 weeks of viral URI symptoms that have improved.  However, 3 days of diarrhea with possible bleeding (reports tarry-like stools).  Has been using 4-6 mg of Imodium with mild improvement.  No current signs of dehydration - Due to this prolonged course and possibility of melena and dehydration complicated by his cerebral palsy.  Will obtain CBC and be met to evaluate for anemia and electrolyte abnormalities - Recommended mixing Gatorade with water to avoid exacerbating diarrhea due to excessive GI sugar load - Recommended increasing Imodium up to 16 mg a day; 2 mg after each loose bowel movement - We'll call with results of blood work - Follow-up in 2-3 days if diarrhea persists
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
## 2015-04-02 LAB — COMPLETE METABOLIC PANEL WITH GFR
ALBUMIN: 4 g/dL (ref 3.6–5.1)
ALK PHOS: 116 U/L — AB (ref 40–115)
ALT: 40 U/L (ref 9–46)
AST: 41 U/L — AB (ref 10–40)
BILIRUBIN TOTAL: 1.5 mg/dL — AB (ref 0.2–1.2)
BUN: 11 mg/dL (ref 7–25)
CALCIUM: 8.9 mg/dL (ref 8.6–10.3)
CO2: 32 mmol/L — ABNORMAL HIGH (ref 20–31)
CREATININE: 0.63 mg/dL (ref 0.60–1.35)
Chloride: 93 mmol/L — ABNORMAL LOW (ref 98–110)
GFR, Est Non African American: >89 mL/min (ref >=60)
Glucose, Bld: 78 mg/dL (ref 65–99)
Potassium: 3 mmol/L — ABNORMAL LOW (ref 3.5–5.3)
Sodium: 141 mmol/L (ref 135–146)
Total Protein: 6.1 g/dL (ref 6.1–8.1)

## 2015-04-02 LAB — CBC WITH DIFFERENTIAL/PLATELET
BASOS PCT: 0 % (ref 0–1)
Basophils Absolute: 0 10*3/uL (ref 0.0–0.1)
EOS ABS: 0 10*3/uL (ref 0.0–0.7)
Eosinophils Relative: 0 % (ref 0–5)
HCT: 42.7 % (ref 39.0–52.0)
HEMOGLOBIN: 14.7 g/dL (ref 13.0–17.0)
Lymphocytes Relative: 37 % (ref 12–46)
Lymphs Abs: 1.4 10*3/uL (ref 0.7–4.0)
MCH: 29.2 pg (ref 26.0–34.0)
MCHC: 34.4 g/dL (ref 30.0–36.0)
MCV: 84.7 fL (ref 78.0–100.0)
MONOS PCT: 9 % (ref 3–12)
MPV: 10.7 fL (ref 8.6–12.4)
Monocytes Absolute: 0.3 10*3/uL (ref 0.1–1.0)
Neutro Abs: 2 10*3/uL (ref 1.7–7.7)
Neutrophils Relative %: 54 % (ref 43–77)
PLATELETS: 172 10*3/uL (ref 150–400)
RBC: 5.04 MIL/uL (ref 4.22–5.81)
RDW: 13.5 % (ref 11.5–15.5)
WBC: 3.7 10*3/uL — AB (ref 4.0–10.5)

## 2015-05-27 ENCOUNTER — Telehealth: Payer: Self-pay | Admitting: Family Medicine

## 2015-05-27 ENCOUNTER — Other Ambulatory Visit: Payer: Self-pay | Admitting: Family Medicine

## 2015-05-27 DIAGNOSIS — R197 Diarrhea, unspecified: Secondary | ICD-10-CM

## 2015-05-27 NOTE — Telephone Encounter (Signed)
Micheal CollinJames R Joyner, MD   Sent: Mon May 27, 2015 10:41 AM    To: Henri MedalJazmin M Hartsell, CMA        Message     Future labs ordered. Please call and let him know he can schedule the lab apt at his convenience.

## 2015-05-27 NOTE — Telephone Encounter (Signed)
Mom informed and appt made for tomorrow. Maryagnes Carrasco, Maryjo RochesterJessica Dawn

## 2015-05-27 NOTE — Telephone Encounter (Signed)
Mother called because her son is feeling better and was told to come back for labs. There are no orders in. Can we put these in and call the mother to schedule Jw

## 2015-05-28 ENCOUNTER — Other Ambulatory Visit: Payer: Medicare Other

## 2015-05-28 ENCOUNTER — Other Ambulatory Visit: Payer: Self-pay | Admitting: Family Medicine

## 2015-05-28 DIAGNOSIS — R197 Diarrhea, unspecified: Secondary | ICD-10-CM

## 2015-05-28 LAB — CBC
HEMATOCRIT: 40.7 % (ref 38.5–50.0)
Hemoglobin: 13.9 g/dL (ref 13.2–17.1)
MCH: 30.3 pg (ref 27.0–33.0)
MCHC: 34.2 g/dL (ref 32.0–36.0)
MCV: 88.7 fL (ref 80.0–100.0)
MPV: 10.3 fL (ref 7.5–12.5)
PLATELETS: 212 10*3/uL (ref 140–400)
RBC: 4.59 MIL/uL (ref 4.20–5.80)
RDW: 13.9 % (ref 11.0–15.0)
WBC: 5.7 10*3/uL (ref 3.8–10.8)

## 2015-05-28 LAB — COMPLETE METABOLIC PANEL WITH GFR
ALK PHOS: 121 U/L — AB (ref 40–115)
ALT: 21 U/L (ref 9–46)
AST: 23 U/L (ref 10–40)
Albumin: 4.3 g/dL (ref 3.6–5.1)
BILIRUBIN TOTAL: 2.4 mg/dL — AB (ref 0.2–1.2)
BUN: 9 mg/dL (ref 7–25)
CALCIUM: 9.2 mg/dL (ref 8.6–10.3)
CHLORIDE: 101 mmol/L (ref 98–110)
CO2: 30 mmol/L (ref 20–31)
CREATININE: 0.57 mg/dL — AB (ref 0.60–1.35)
GFR, Est Non African American: >89 mL/min (ref >=60)
Glucose, Bld: 80 mg/dL (ref 65–99)
Potassium: 3.5 mmol/L (ref 3.5–5.3)
Sodium: 140 mmol/L (ref 135–146)
Total Protein: 6.4 g/dL (ref 6.1–8.1)

## 2015-05-28 NOTE — Progress Notes (Signed)
cmp and cbc done today Renny Remer 

## 2015-05-29 LAB — BILIRUBIN, FRACTIONATED(TOT/DIR/INDIR)
BILIRUBIN INDIRECT: 2 mg/dL — AB (ref 0.2–1.2)
Bilirubin, Direct: 0.4 mg/dL — ABNORMAL HIGH (ref <=0.2)
Total Bilirubin: 2.4 mg/dL — ABNORMAL HIGH (ref 0.2–1.2)

## 2015-05-29 LAB — GAMMA GT: GGT: 139 U/L — AB (ref 7–51)

## 2015-06-04 ENCOUNTER — Telehealth: Payer: Self-pay | Admitting: Family Medicine

## 2015-06-04 DIAGNOSIS — R17 Unspecified jaundice: Secondary | ICD-10-CM

## 2015-06-04 DIAGNOSIS — R748 Abnormal levels of other serum enzymes: Secondary | ICD-10-CM

## 2015-06-04 NOTE — Telephone Encounter (Signed)
Called and discussed lab results with his mother. R note that his alkaline phosphatase, bilirubin and GGT, were elevated on the last blood work. He reports that he is doing well and has no complaints: No abdominal pain, nausea, diarrhea or fevers. He is status post cholecystectomy. Gave her the option to proceed with abdominal Ultrasound vs reevaluation in 1-2 months  with repeat blood work. She will discuss this with Tinnie GensJeffrey and let me know their decision.

## 2015-06-10 DIAGNOSIS — R748 Abnormal levels of other serum enzymes: Secondary | ICD-10-CM | POA: Insufficient documentation

## 2015-06-10 NOTE — Addendum Note (Signed)
Addended by: Jamal CollinJOYNER, Ocia Simek R on: 06/10/2015 10:56 AM   Modules accepted: Orders

## 2015-06-10 NOTE — Telephone Encounter (Signed)
Will forward to MD to place order for ultrasound and we can get this scheduled for June. Jazmin Hartsell,CMA

## 2015-06-10 NOTE — Assessment & Plan Note (Signed)
Mother called to verify to proceed with imaging for further evaluation of his elevated alkaline phosphatase, GGT and bilirubin.  He is status post cholecystectomy - Right upper quadrant ultrasound ordered - We'll follow-up with results of ultrasound

## 2015-06-10 NOTE — Telephone Encounter (Signed)
Spoke with patient mother, she states they would like to proceed with ultrasound. Would like for it to be scheduled the first week of June.

## 2015-06-13 ENCOUNTER — Other Ambulatory Visit: Payer: Self-pay | Admitting: *Deleted

## 2015-06-14 MED ORDER — LEVETIRACETAM 500 MG PO TABS: ORAL_TABLET | ORAL | Status: DC

## 2015-06-19 DIAGNOSIS — R17 Unspecified jaundice: Secondary | ICD-10-CM | POA: Insufficient documentation

## 2015-07-22 ENCOUNTER — Ambulatory Visit (HOSPITAL_COMMUNITY)
Admission: RE | Admit: 2015-07-22 | Discharge: 2015-07-22 | Disposition: A | Discharge status: Home / Self Care | Payer: Medicare Other | Source: Ambulatory Visit | Attending: Family Medicine | Admitting: Family Medicine

## 2015-07-22 DIAGNOSIS — R748 Abnormal levels of other serum enzymes: Secondary | ICD-10-CM | POA: Insufficient documentation

## 2015-07-24 ENCOUNTER — Telehealth: Payer: Self-pay | Admitting: *Deleted

## 2015-07-24 NOTE — Telephone Encounter (Signed)
Pt mom calling wanting to know the results of his ultrasound results. Please call her. Deseree Bruna PotterBlount, CMA

## 2015-07-24 NOTE — Telephone Encounter (Signed)
Called mother and informed that RUQ [place] was normal. Last step to evaluate elevated ALK Phos is to check AMA. If normal then reassurance and observation. If abnormal would need referred to GI for consideration of ERCP or MRCP vs liver biopsy.  - Scheduled apt 08/12/15

## 2015-08-12 ENCOUNTER — Ambulatory Visit (INDEPENDENT_AMBULATORY_CARE_PROVIDER_SITE_OTHER): Payer: Medicare Other | Admitting: Family Medicine

## 2015-08-12 ENCOUNTER — Encounter: Payer: Self-pay | Admitting: Family Medicine

## 2015-08-12 VITALS — BP 130/90 | Temp 97.9°F | Ht 69.0 in | Wt 210.0 lb

## 2015-08-12 DIAGNOSIS — G43C0 Periodic headache syndromes in child or adult, not intractable: Secondary | ICD-10-CM | POA: Diagnosis not present

## 2015-08-12 DIAGNOSIS — R748 Abnormal levels of other serum enzymes: Secondary | ICD-10-CM | POA: Diagnosis present

## 2015-08-12 NOTE — Assessment & Plan Note (Signed)
Still asymptomatic.  Elevated alkaline phosphatase and bilirubin, likely related to history of cholecystectomy.  Negative RUQ ultrasound reassuring, however, will check antimitochondrial antibodies to complete evaluation of elevated alkaline phosphatase

## 2015-08-12 NOTE — Progress Notes (Signed)
  Patient name: [name] Tun MRN [id number]  Date of birth: 03-19-1965  CC & HPI:  [name] Straley is a 50 y.o. male presenting today for Follow-up for elevated alkaline phosphatase and headaches.   Elevated alkaline phosphatase - He continues to deny any right upper quadrant abdominal pain, nausea, vomiting - History of gallbladder removal - Follow-up today to check  AMA after negative RUQ ultrasound evaluation  Headaches - He reports history of migraine headaches 1-3 times a month.  Pressure-like pain worse with bright lights, cigarette smoke and weather changes.  Occasionally has some nausea with rare episodes of vomiting.  Headaches resolve with Tylenol.  Headaches rarely last longer than several hours to 1 day.   - Denies any double vision or blurry vision - Denies any fevers, chills - He reports seizures are well controlled on Keppra, has not followed up with neurologist in 1.5 years  Smoking History Noted  Objective Findings:  Vitals: BP 130/90 mmHg  Temp(Src) 97.9 F (36.6 C) (Oral)  Ht 5\' 9"  (1.753 m)  Wt 210 lb (95.255 kg)  BMI 31.00 kg/m2  Gen: NAD CV: RRR w/o m/Santiago/g, pulses +2 b/l Resp: CTAB w/ normal respiratory effort GI: No skin changes; BS + x 4 quads; No tenderness or masses  Assessment & Plan:   Migraine Headache consistent with migraines occurring 1-3 times monthly that resolve with OTC medications - Recommended keeping a headache diary to evaluate possible triggers - He will discuss with his neurologist when he follows up with them, however, he is not interested in prophylactic medications at this time - Follow-up when necessary  Elevated alkaline phosphatase level Still asymptomatic.  Elevated alkaline phosphatase and bilirubin, likely related to history of cholecystectomy.  Negative RUQ ultrasound reassuring, however, will check antimitochondrial antibodies to complete evaluation of elevated alkaline phosphatase

## 2015-08-12 NOTE — Patient Instructions (Signed)
It was great seeing you today.   I have order some labs (antimitochondrial antibodies) today to check your elevated liver enzymes. I will call you with the results.  I recommend keeping a headache diary, and discussing this with you neurologist  Next Appointment  Please make an appointment with Dr [name] in 3 months   I look forward to talking with you again at our next visit. If you have any questions or concerns before then, please call the clinic at [phone].  Take Care,   Dr Wenda LowJames Quintrell Baze

## 2015-08-12 NOTE — Assessment & Plan Note (Signed)
Headache consistent with migraines occurring 1-3 times monthly that resolve with OTC medications - Recommended keeping a headache diary to evaluate possible triggers - He will discuss with his neurologist when he follows up with them, however, he is not interested in prophylactic medications at this time - Follow-up when necessary

## 2015-08-13 ENCOUNTER — Telehealth: Payer: Self-pay | Admitting: Family Medicine

## 2015-08-13 LAB — MITOCHONDRIAL ANTIBODIES

## 2015-08-13 NOTE — Telephone Encounter (Signed)
Mom returned dr Scarlette Shortsjoyners call

## 2015-08-13 NOTE — Telephone Encounter (Signed)
Called and discussed results of recent negative mitochondrial antibody test.  Reassured mother that given negative lab evaluation and right upper quadrant ultrasound his elevated alkaline phosphatase and bilirubin is likely related to his previous gallbladder surgery.  No need for additional evaluation at this time, however, if he develops any persistent epigastric right upper quadrant abdominal pain, nausea, vomiting, or jaundice he should be reevaluated.

## 2015-09-02 ENCOUNTER — Other Ambulatory Visit: Payer: Self-pay | Admitting: *Deleted

## 2015-09-03 NOTE — Telephone Encounter (Signed)
2nd request.  Liliann File L, RN  

## 2015-09-04 MED ORDER — BECLOMETHASONE DIPROPIONATE 80 MCG/ACT IN AERS: INHALATION_SPRAY | RESPIRATORY_TRACT | Status: DC

## 2015-09-04 NOTE — Telephone Encounter (Signed)
Rx filled.  Lamiyah Schlotter M. Olan Kurek, MD [facility] Resident PGY-3 09/04/2015 4:01 PM   

## 2015-09-05 ENCOUNTER — Telehealth: Payer: Self-pay | Admitting: *Deleted

## 2015-09-05 DIAGNOSIS — J4520 Mild intermittent asthma, uncomplicated: Secondary | ICD-10-CM

## 2015-09-05 MED ORDER — FLUTICASONE PROPIONATE HFA 110 MCG/ACT IN AERO: 2.0000 | INHALATION_SPRAY | Freq: Two times a day (BID) | RESPIRATORY_TRACT | Status: DC

## 2015-09-05 NOTE — Telephone Encounter (Signed)
Formulary alternative sent in.  [name] M. [name], MD [facility] Resident PGY-3 09/05/2015 2:32 PM

## 2015-09-05 NOTE — Telephone Encounter (Signed)
[facility] Authorization received from [facility] for Qvar. PA was denied for Qvar 03/27/15.   Formulary alternatives are Budesonide Sus 0.25 mg/2 ML, 0.5 mg/2 mL or 1 mg /2 mL; Arnuity Elpt Inh 100 mcg or 200 mcg; Flovent Dis AER 20 mcg, 100 mcg or 250 mcg or Flovent HFA AER 44 mcg, 110 mcg or 220 mcg. Fluticasone (FLOVENT HFA) 110 MCG/ACT inhaler was sent in by previous provider on 03/29/15.  Clovis PuMartin, Dontavis Tschantz L, RN

## 2015-09-19 ENCOUNTER — Telehealth: Payer: Self-pay | Admitting: Family Medicine

## 2015-09-19 NOTE — Telephone Encounter (Signed)
Mother came by the office and dropped off forms for the doctor to fill about her son's need for a wheel chair. Please call mom when ready to pick up. Forms left in [facility] team folder at front desk. jw

## 2015-09-19 NOTE — Telephone Encounter (Signed)
Clinic portion filled out, placed in providers box for review.  

## 2015-09-20 NOTE — Telephone Encounter (Signed)
Form completed and left at front.  Katina Degree. Jimmey Ralph, MD [facility] Resident PGY-3 09/20/2015 12:15 PM

## 2015-09-20 NOTE — Telephone Encounter (Signed)
Left message informing pt his form was completed and is ready for pick.

## 2015-12-30 ENCOUNTER — Other Ambulatory Visit: Payer: Self-pay | Admitting: Family Medicine

## 2015-12-30 DIAGNOSIS — J4520 Mild intermittent asthma, uncomplicated: Secondary | ICD-10-CM

## 2015-12-30 MED ORDER — LEVETIRACETAM 500 MG PO TABS: ORAL_TABLET | ORAL | 2 refills | Status: DC

## 2015-12-30 MED ORDER — FLUTICASONE PROPIONATE HFA 110 MCG/ACT IN AERO: 2.0000 | INHALATION_SPRAY | Freq: Two times a day (BID) | RESPIRATORY_TRACT | 11 refills | Status: DC

## 2015-12-30 MED ORDER — ALBUTEROL SULFATE HFA 108 (90 BASE) MCG/ACT IN AERS: INHALATION_SPRAY | RESPIRATORY_TRACT | 11 refills | Status: DC

## 2015-12-30 NOTE — Telephone Encounter (Signed)
Pt would like to [facility] to [facility]. 90 day supply for inhaler and keppra. Fax [phone]. Please advise. Thanks! ep

## 2015-12-30 NOTE — Telephone Encounter (Signed)
Rx filled. Patient needs office visit for further refills.  Micheal Degreealeb M. [name], MD [facility] Resident PGY-3 12/30/2015 1:57 PM

## 2015-12-30 NOTE — Telephone Encounter (Signed)
Pt informed and appt made. Fleeger, Jessica Dawn, CMA  

## 2015-12-31 ENCOUNTER — Encounter: Payer: Self-pay | Admitting: Family Medicine

## 2015-12-31 ENCOUNTER — Ambulatory Visit (INDEPENDENT_AMBULATORY_CARE_PROVIDER_SITE_OTHER): Payer: Medicare Other | Admitting: Family Medicine

## 2015-12-31 DIAGNOSIS — K529 Noninfective gastroenteritis and colitis, unspecified: Secondary | ICD-10-CM

## 2015-12-31 MED ORDER — LOPERAMIDE HCL 2 MG PO CAPS: 4.0000 mg | ORAL_CAPSULE | Freq: Every day | ORAL | 3 refills | Status: DC

## 2015-12-31 NOTE — Progress Notes (Signed)
    Subjective:  Sloan LeiterJeffrey R Amoroso is a 50 y.o. male who [facility] with a chief complaint of diarrhea.   HPI:  Diarrhea Patient with history of cholecystectomy and has chronic loose stools related to this. He takes imodium daily to help with this. Patient reports eating a moderate amount of fatty foods. No abdominal pain. No nausea or vomiting. No fevers or chills.  ROS: Per HPI  Objective:  Physical Exam: BP 112/62   Pulse 77   Temp 97.6 F (36.4 C) (Oral)   SpO2 95%   Gen: NAD, resting comfortably CV: RRR with no murmurs appreciated Pulm: NWOB, CTAB with no crackles, wheezes, or rhonchi GI: Normal bowel sounds present. Soft, Nontender, Nondistended. MSK: no edema, cyanosis, or clubbing noted Skin: warm, dry Neuro: Wheelchair bound, spasticity of UE noted.  Psych: Normal affect and thought content  Assessment/Plan:  Chronic diarrhea Seems to be at baseline, though patient would like to know if there are any options aside from imodium. Discussed fiber supplements such as metamucial and trying to eat a diet high in fiber. Discussed avoiding fatty foods. Return precautions reviewed. Consider trial of cholestyramine if not improving. Follow up as needed.    Katina Degreealeb M. [name], MD [facility] Resident PGY-3 12/31/2015 5:25 PM

## 2015-12-31 NOTE — Assessment & Plan Note (Addendum)
Seems to be at baseline, though patient would like to know if there are any options aside from imodium. Discussed fiber supplements such as metamucial and trying to eat a diet high in fiber. Discussed avoiding fatty foods. Return precautions reviewed. Consider trial of cholestyramine if not improving. Follow up as needed.

## 2015-12-31 NOTE — Patient Instructions (Signed)
Try taking more fiber to help with the loose stools. You can use metamucil. You can also try psyllium husks.   Come back to see me in 6 months, or sooner if you need anything else.  Take care,  Dr [name]

## 2016-02-20 ENCOUNTER — Telehealth: Payer: Self-pay | Admitting: Family Medicine

## 2016-02-20 NOTE — Telephone Encounter (Signed)
Mom needs a letter for Social Security stating they can talk to her because pt is disabled. Mom has a medical power of attorney, but the social security office will not accept that. Please call mom when note is ready to be picked up. ep

## 2016-02-21 NOTE — Telephone Encounter (Signed)
Spoke with pts mother and she stated she no longer needs a letter. Pts mother stated, "if this changes ill call back and make an apt."

## 2016-02-21 NOTE — Telephone Encounter (Signed)
I am not sure what the mother needs exactly. We are not authorized to make those sorts of determinations. Her HCPOA should be sufficient. It would be helpful if she and the patient could come in for a visit to discuss the paperwork they need.  Katina Degreealeb M. [name], MD [facility] Resident PGY-3 02/21/2016 1:51 PM

## 2016-04-13 ENCOUNTER — Encounter: Payer: Self-pay | Admitting: Internal Medicine

## 2016-04-13 ENCOUNTER — Ambulatory Visit (INDEPENDENT_AMBULATORY_CARE_PROVIDER_SITE_OTHER): Payer: Medicare Other | Admitting: Internal Medicine

## 2016-04-13 VITALS — BP 110/72 | HR 74 | Temp 97.9°F

## 2016-04-13 DIAGNOSIS — R609 Edema, unspecified: Secondary | ICD-10-CM | POA: Diagnosis not present

## 2016-04-13 DIAGNOSIS — M7989 Other specified soft tissue disorders: Secondary | ICD-10-CM | POA: Diagnosis present

## 2016-04-13 LAB — CBC
HEMATOCRIT: 43.7 % (ref 38.5–50.0)
HEMOGLOBIN: 14.8 g/dL (ref 13.2–17.1)
MCH: 29.9 pg (ref 27.0–33.0)
MCHC: 33.9 g/dL (ref 32.0–36.0)
MCV: 88.3 fL (ref 80.0–100.0)
MPV: 10.7 fL (ref 7.5–12.5)
Platelets: 239 10*3/uL (ref 140–400)
RBC: 4.95 MIL/uL (ref 4.20–5.80)
RDW: 13.4 % (ref 11.0–15.0)
WBC: 7.6 10*3/uL (ref 3.8–10.8)

## 2016-04-13 LAB — COMPREHENSIVE METABOLIC PANEL
ALT: 23 U/L (ref 9–46)
AST: 23 U/L (ref 10–40)
Albumin: 4.7 g/dL (ref 3.6–5.1)
Alkaline Phosphatase: 117 U/L — ABNORMAL HIGH (ref 40–115)
BILIRUBIN TOTAL: 2 mg/dL — AB (ref 0.2–1.2)
BUN: 15 mg/dL (ref 7–25)
CHLORIDE: 104 mmol/L (ref 98–110)
CO2: 31 mmol/L (ref 20–31)
CREATININE: 0.69 mg/dL (ref 0.60–1.35)
Calcium: 9.8 mg/dL (ref 8.6–10.3)
Glucose, Bld: 86 mg/dL (ref 65–99)
Potassium: 4.6 mmol/L (ref 3.5–5.3)
SODIUM: 142 mmol/L (ref 135–146)
TOTAL PROTEIN: 6.6 g/dL (ref 6.1–8.1)

## 2016-04-13 LAB — TSH: TSH: 2.79 mIU/L (ref 0.40–4.50)
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
## 2016-04-14 ENCOUNTER — Telehealth: Payer: Self-pay | Admitting: Internal Medicine

## 2016-04-14 NOTE — Telephone Encounter (Signed)
Please let patient know that his blood work to evaluate his swelling was unremarkable. Thank you

## 2016-04-14 NOTE — Telephone Encounter (Signed)
Left detailed message on mothers phone informing her of patients blood work and to call the office if any further questions.

## 2016-04-15 ENCOUNTER — Encounter (HOSPITAL_COMMUNITY): Payer: Self-pay

## 2016-04-20 ENCOUNTER — Ambulatory Visit (HOSPITAL_COMMUNITY)
Admission: RE | Admit: 2016-04-20 | Discharge: 2016-04-20 | Disposition: A | Discharge status: Home / Self Care | Payer: Medicare Other | Source: Ambulatory Visit | Attending: Family Medicine | Admitting: Family Medicine

## 2016-04-20 DIAGNOSIS — M7989 Other specified soft tissue disorders: Secondary | ICD-10-CM | POA: Insufficient documentation

## 2016-04-20 NOTE — Progress Notes (Signed)
VASCULAR LAB PRELIMINARY  PRELIMINARY  PRELIMINARY  PRELIMINARY  Right lower extremity venous duplex completed.    Preliminary report:  Right:  No evidence of DVT, superficial thrombosis, or Baker's cyst.  Mehtaab Mayeda, RVS 04/20/2016, 12:04 PM

## 2016-04-22 ENCOUNTER — Telehealth: Payer: Self-pay | Admitting: Internal Medicine

## 2016-04-22 NOTE — Telephone Encounter (Signed)
Please call patient to inform that ultrasound of lower extremity did not show a blood clot.

## 2016-04-23 ENCOUNTER — Encounter: Payer: Self-pay | Admitting: Pharmacist

## 2016-04-23 ENCOUNTER — Ambulatory Visit (INDEPENDENT_AMBULATORY_CARE_PROVIDER_SITE_OTHER): Payer: Medicare Other | Admitting: Pharmacist

## 2016-04-23 DIAGNOSIS — J4520 Mild intermittent asthma, uncomplicated: Secondary | ICD-10-CM

## 2016-04-23 DIAGNOSIS — R202 Paresthesia of skin: Secondary | ICD-10-CM

## 2016-04-23 DIAGNOSIS — R200 Anesthesia of skin: Secondary | ICD-10-CM

## 2016-04-23 NOTE — Progress Notes (Signed)
Patient ID: [name], male   DOB: [birth year], 51 y.o.   MRN: [id number] Reviewed: Agree with Dr. Macky LowerKoval's documentation and management.
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
## 2016-04-23 NOTE — Assessment & Plan Note (Signed)
Chronic asthma currently controlled.  Due to difficulty with use of Flovent due to short device tube he was advised to use spacer with Flovent.  Reports he has spacers at home.  Reevaluate at next visit.

## 2016-04-23 NOTE — Patient Instructions (Signed)
Nice to see you today!   Blood flow test today was normal. Try to keep your legs elevated as much as possible to help with swelling.  Follow up when you feel like you need to.

## 2016-04-23 NOTE — Telephone Encounter (Signed)
Pts mother has already been informed.

## 2016-05-22 ENCOUNTER — Ambulatory Visit: Payer: Self-pay | Admitting: Family Medicine

## 2016-06-03 ENCOUNTER — Encounter: Payer: Self-pay | Admitting: Family Medicine

## 2016-06-03 ENCOUNTER — Ambulatory Visit (INDEPENDENT_AMBULATORY_CARE_PROVIDER_SITE_OTHER): Payer: Medicare Other | Admitting: Family Medicine

## 2016-06-03 VITALS — BP 112/75 | HR 77 | Temp 97.7°F | Ht 69.0 in

## 2016-06-03 DIAGNOSIS — F39 Unspecified mood [affective] disorder: Secondary | ICD-10-CM | POA: Diagnosis present

## 2016-06-03 DIAGNOSIS — R4586 Emotional lability: Secondary | ICD-10-CM

## 2016-06-03 NOTE — Patient Instructions (Signed)
Please contact the blow if you are interested.  We are located in the [facility] at [address]. [address] [place], [place] [address]  Phone: [phone]  Fax: [phone]
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
## 2016-09-09 ENCOUNTER — Telehealth: Payer: Self-pay | Admitting: Family Medicine

## 2016-09-09 ENCOUNTER — Other Ambulatory Visit: Payer: Self-pay | Admitting: Family Medicine

## 2016-09-09 MED ORDER — LEVETIRACETAM 500 MG PO TABS: ORAL_TABLET | ORAL | 2 refills | Status: DC

## 2016-09-09 MED ORDER — LOPERAMIDE HCL 2 MG PO CAPS: 2.0000 mg | ORAL_CAPSULE | Freq: Every day | ORAL | 2 refills | Status: DC

## 2016-09-09 NOTE — Telephone Encounter (Signed)
You can let patient know both medications have been sent to the pharmacy.  Thank you   Lovena NeighboursAbdoulaye Dot Splinter, MD [facility], PGY-2

## 2016-09-09 NOTE — Telephone Encounter (Signed)
Mother is  calling to request refill of:  Name of Medication(s):  Loperamide and Levetiracetam. The pharmacy said that they needed doctors approval before filling this.  Last date of OV:  05/2016 [facility]  Will route refill request to Clinic RN.  Discussed with patient policy to call pharmacy for future refills.  Also, discussed refills may take up to 48 hours to approve or deny.  Caren MacadamJacqueline L Warner

## 2016-09-10 NOTE — Telephone Encounter (Signed)
Pts mother contacted and informed of medication sent to pharmacy.

## 2016-10-01 ENCOUNTER — Telehealth: Payer: Self-pay | Admitting: Family Medicine

## 2016-10-01 MED ORDER — LOPERAMIDE HCL 2 MG PO CAPS: 2.0000 mg | ORAL_CAPSULE | Freq: Every day | ORAL | 2 refills | Status: DC

## 2016-10-01 NOTE — Telephone Encounter (Signed)
Medication sent to [facility] per patient's mom request.  Clovis PuMartin, Nikolaos Maddocks L, RN

## 2016-10-01 NOTE — Telephone Encounter (Signed)
Mom called because we [facility] the patients L[facility]. Can we call this in to the, [phone]. jw

## 2016-10-06 NOTE — Telephone Encounter (Signed)
Carol from [facility] called and lmovm.  Pt is previously on loperamide (IMODIUM) 2 MG capsule 2 capsules daily.  Last Rx changed to 1 capsul daily.  Is this correct?  Also can it be a  90 day supply, since they are mail order?  Fleeger, Maryjo Rochester, CMA

## 2016-10-07 ENCOUNTER — Encounter: Payer: Self-pay | Admitting: *Deleted

## 2016-10-07 NOTE — Telephone Encounter (Signed)
Left voice message for [name] with clarification regarding Imodium.  Please see note from PCP below.  Clovis Pu, RN

## 2016-10-07 NOTE — Telephone Encounter (Signed)
Patient can do 1 capsule of imodium with increase fiber (Metamucil) in their diet. If patient is still experiencing diarrhea/loose stools, he can increase it to 2 capsules a day.  Thank you  Lovena Neighbours, MD [facility], PGY-2

## 2016-10-07 NOTE — Telephone Encounter (Signed)
This encounter was created in error - please disregard.

## 2016-10-07 NOTE — Telephone Encounter (Signed)
Marquilia with [facility] calling to get qualification on loperamide directions. Please see previous message. Please give her a call at [phone].  Clovis Pu, RN

## 2016-10-12 ENCOUNTER — Other Ambulatory Visit: Payer: Self-pay | Admitting: Family Medicine

## 2016-10-12 MED ORDER — LOPERAMIDE HCL 2 MG PO CAPS: 2.0000 mg | ORAL_CAPSULE | Freq: Every day | ORAL | 2 refills | Status: DC

## 2017-06-28 ENCOUNTER — Encounter: Payer: Self-pay | Admitting: Family Medicine

## 2017-06-28 ENCOUNTER — Other Ambulatory Visit: Payer: Self-pay

## 2017-06-28 ENCOUNTER — Ambulatory Visit (INDEPENDENT_AMBULATORY_CARE_PROVIDER_SITE_OTHER): Payer: Medicare Other | Admitting: Family Medicine

## 2017-06-28 VITALS — BP 116/72 | HR 108 | Temp 98.1°F

## 2017-06-28 DIAGNOSIS — B9789 Other viral agents as the cause of diseases classified elsewhere: Secondary | ICD-10-CM

## 2017-06-28 DIAGNOSIS — J069 Acute upper respiratory infection, unspecified: Secondary | ICD-10-CM

## 2017-06-28 MED ORDER — FLUTICASONE PROPIONATE 50 MCG/ACT NA SUSP: 2.0000 | Freq: Every day | NASAL | 6 refills | Status: DC

## 2017-06-28 MED ORDER — LEVETIRACETAM 500 MG PO TABS: ORAL_TABLET | ORAL | 2 refills | Status: DC

## 2017-06-28 MED ORDER — LOPERAMIDE HCL 2 MG PO CAPS: 2.0000 mg | ORAL_CAPSULE | Freq: Every day | ORAL | 2 refills | Status: DC

## 2017-06-28 NOTE — Patient Instructions (Signed)
Upper Respiratory Infection, Adult Most upper respiratory infections (URIs) are caused by a virus. A URI affects the nose, throat, and upper air passages. The most common type of URI is often called "the common cold." Follow these instructions at home:  Take medicines only as told by your doctor.  Gargle warm saltwater or take cough drops to comfort your throat as told by your doctor.  Use a warm mist humidifier or inhale steam from a shower to increase air moisture. This may make it easier to breathe.  Drink enough fluid to keep your pee (urine) clear or pale yellow.  Eat soups and other clear broths.  Have a healthy diet.  Rest as needed.  Go back to work when your fever is gone or your doctor says it is okay. ? You may need to stay home longer to avoid giving your URI to others. ? You can also wear a face mask and wash your hands often to prevent spread of the virus.  Use your inhaler more if you have asthma.  Do not use any tobacco products, including cigarettes, chewing tobacco, or electronic cigarettes. If you need help quitting, ask your doctor. Contact a doctor if:  You are getting worse, not better.  Your symptoms are not helped by medicine.  You have chills.  You are getting more short of breath.  You have brown or red mucus.  You have yellow or brown discharge from your nose.  You have pain in your face, especially when you bend forward.  You have a fever.  You have puffy (swollen) neck glands.  You have pain while swallowing.  You have white areas in the back of your throat. Get help right away if:  You have very bad or constant: ? Headache. ? Ear pain. ? Pain in your forehead, behind your eyes, and over your cheekbones (sinus pain). ? Chest pain.  You have long-lasting (chronic) lung disease and any of the following: ? Wheezing. ? Long-lasting cough. ? Coughing up blood. ? A change in your usual mucus.  You have a stiff neck.  You have  changes in your: ? Vision. ? Hearing. ? Thinking. ? Mood. This information is not intended to replace advice given to you by your health care provider. Make sure you discuss any questions you have with your health care provider. Document Released: 07/22/2007 Document Revised: 10/06/2015 Document Reviewed: 05/10/2013 Elsevier Interactive Patient Education  2018 Elsevier Inc.  

## 2017-07-01 DIAGNOSIS — B9789 Other viral agents as the cause of diseases classified elsewhere: Principal | ICD-10-CM

## 2017-07-01 DIAGNOSIS — J069 Acute upper respiratory infection, unspecified: Secondary | ICD-10-CM | POA: Insufficient documentation

## 2017-07-01 HISTORY — DX: Acute upper respiratory infection, unspecified: J06.9

## 2017-07-01 NOTE — Progress Notes (Signed)
   Subjective:    Patient ID: [name], male    DOB: Nov 03, 1965, 52 y.o.   MRN: [id number]   CC: Cough, congestion  HPI: Patient is a 52 year old male who presents today complaining of cough, congestion, for the past few days.  Patient reports that sister had similar symptoms.  His symptoms started with sore throat.  He then started to experience rhinorrhea and congestion.  Patient denies any shortness of breath or use of accessory muscle.  Patient has a history of pneumonia which has been concerning to him given current pulmonary state  Smoking status reviewed   ROS: all other systems were reviewed and are negative other than in the HPI   Past Medical History:  Diagnosis Date  . Asthma    albuterol has to rarely usely  . Bronchitis    hx of  . Cerebral palsy (HCC)   . 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 Collapse, lung 2005   left  . Gait disorder   . Migraine   . Migraine   . Obesity   . Peripheral edema   . Pneumonia    hx of  . Vitamin D deficiency     Past Surgical History:  Procedure Laterality Date  . ANTERIOR CERVICAL DECOMP/DISCECTOMY FUSION N/A 09/26/2012   Procedure: Cervical six-seven Anterior cervical decompression/diskectomy/fusion;  Surgeon: Barnett Abu, MD;  Location: [facility];  Service: Neurosurgery;  Laterality: N/A;  . CERVICAL FUSION    . CHOLECYSTECTOMY    . INTERTROCHANTERIC HIP FRACTURE SURGERY Right   . LAMINECTOMY LUMBAR SPLINE W/ PLACEMENT SPINAL CORD STIMULATOR    . LUNG SURGERY  2004  . TENDON TRANSFER     Multiple, both legs    Past medical history, surgical, family, and social history reviewed and updated in the EMR as appropriate.  Objective:  BP 116/72   Pulse (!) 108   Temp 98.1 F (36.7 C) (Oral)   SpO2 97%   Vitals and nursing note reviewed  General: Patient is wheelchair-bound, NAD, pleasant, able to participate in exam Cardiac: RRR, normal heart sounds, no murmurs. 2+ radial and PT pulses  bilaterally Respiratory: CTAB, normal effort, No wheezes, rales or rhonchi Abdomen: soft, nontender, nondistended, no hepatic or splenomegaly, +BS Extremities: no edema or cyanosis. WWP. Skin: warm and dry, no rashes noted Neuro: alert and oriented x4, no focal deficits Psych: Normal affect and mood   Assessment & Plan:    Viral URI with cough Patient presents today complaining of cough, rhinorrhea, congestion in the setting of sick contacts for the past 3 days.  Denies any wheezing, fever, chills concerning for possible infectious process.  No use of accessory muscle exam moving axillary bilaterally.  Patient symptoms and physical exam most likely diagnosis would be viral URI.  Recommending symptom control for cough.  Strict return precaution discussed if develop fever, chills or other signs concerning for infection.  Patient mother verbalized understanding and agreement with plan.  We will follow-up in clinic on as-needed basis.    Lovena Neighbours, MD [facility] PGY-2

## 2017-07-01 NOTE — Assessment & Plan Note (Signed)
Patient presents today complaining of cough, rhinorrhea, congestion in the setting of sick contacts for the past 3 days.  Denies any wheezing, fever, chills concerning for possible infectious process.  No use of accessory muscle exam moving axillary bilaterally.  Patient symptoms and physical exam most likely diagnosis would be viral URI.  Recommending symptom control for cough.  Strict return precaution discussed if develop fever, chills or other signs concerning for infection.  Patient mother verbalized understanding and agreement with plan.  We will follow-up in clinic on as-needed basis.

## 2017-07-02 ENCOUNTER — Other Ambulatory Visit: Payer: Self-pay | Admitting: Family Medicine

## 2017-07-02 ENCOUNTER — Telehealth: Payer: Self-pay

## 2017-07-02 MED ORDER — LEVETIRACETAM 500 MG PO TABS: ORAL_TABLET | ORAL | 2 refills | Status: DC

## 2017-07-02 MED ORDER — LOPERAMIDE HCL 2 MG PO CAPS: 2.0000 mg | ORAL_CAPSULE | Freq: Every day | ORAL | 2 refills | Status: DC

## 2017-07-02 NOTE — Telephone Encounter (Signed)
Rx sent per Dr. Garlan Fair, RN

## 2017-07-02 NOTE — Telephone Encounter (Signed)
Hi meredith,   You can go ahead and order medications for him.   Thanks  Lovena Neighbours, MD [facility], PGY-2

## 2017-07-02 NOTE — Telephone Encounter (Signed)
[facility] mail order calling to see if we can call in a 90 day supply of patients Keppra and Lopirimide. Can escribe or call with ok to [phone] Shawna Orleans, RN

## 2018-01-31 ENCOUNTER — Other Ambulatory Visit: Payer: Self-pay

## 2018-01-31 MED ORDER — ALBUTEROL SULFATE HFA 108 (90 BASE) MCG/ACT IN AERS: INHALATION_SPRAY | RESPIRATORY_TRACT | 11 refills | Status: DC

## 2018-02-03 ENCOUNTER — Telehealth: Payer: Self-pay | Admitting: *Deleted

## 2018-02-03 DIAGNOSIS — J4520 Mild intermittent asthma, uncomplicated: Secondary | ICD-10-CM

## 2018-02-03 MED ORDER — FLUTICASONE PROPIONATE HFA 110 MCG/ACT IN AERO: 2.0000 | INHALATION_SPRAY | Freq: Two times a day (BID) | RESPIRATORY_TRACT | 11 refills | Status: DC

## 2018-02-07 NOTE — Telephone Encounter (Signed)
Pts pharmacy called stating the rx for inhaler was called into pharmacy for a years supply, to be dispensed one month at a time. Pts pharmacy would like to dispense a 90 supply with 4 refills for cheaper copay and compliance.   I approved this request.

## 2018-02-07 NOTE — Telephone Encounter (Signed)
Thank you for the update.  Trinidee Schrag, MD [facility], PGY-3  

## 2018-04-21 ENCOUNTER — Ambulatory Visit: Payer: Medicare Other | Attending: Family Medicine | Admitting: Physical Therapy

## 2018-04-21 DIAGNOSIS — R2689 Other abnormalities of gait and mobility: Secondary | ICD-10-CM

## 2018-04-21 DIAGNOSIS — G809 Cerebral palsy, unspecified: Secondary | ICD-10-CM | POA: Diagnosis present

## 2018-04-22 ENCOUNTER — Encounter: Payer: Self-pay | Admitting: Physical Therapy

## 2018-04-22 NOTE — Therapy (Signed)
[facility] [address] [place], [place], [address] Phone: [phone]   Fax:  [phone]  Physical Therapy Evaluation  Patient Details  Name: [name] MRN: [id number] Date of Birth: 1965-06-02 Referring Provider (PT): Madison Hickman, MD   Encounter Date: 04/21/2018  PT End of Session - 04/22/18 1442    Visit Number  1    Number of Visits  1    Authorization Type  Medicare/Medicaid    Authorization Time Period  04-21-18 - 05-22-18    PT Start Time  1315    PT Stop Time  1432    PT Time Calculation (min)  77 min       Past Medical History:  Diagnosis Date  . Asthma    albuterol has to rarely usely  . Bronchitis    hx of  . Cerebral palsy ([place])   . 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 Collapse, lung 2005   left  . Gait disorder   . Migraine   . Migraine   . Obesity   . Peripheral edema   . Pneumonia    hx of  . Vitamin D deficiency     Past Surgical History:  Procedure Laterality Date  . ANTERIOR CERVICAL DECOMP/DISCECTOMY FUSION N/A 09/26/2012   Procedure: Cervical six-seven Anterior cervical decompression/diskectomy/fusion;  Surgeon: Kristeen Miss, MD;  Location: [facility];  Service: Neurosurgery;  Laterality: N/A;  . CERVICAL FUSION    . CHOLECYSTECTOMY    . INTERTROCHANTERIC HIP FRACTURE SURGERY Right   . LAMINECTOMY LUMBAR SPLINE W/ PLACEMENT SPINAL CORD STIMULATOR    . LUNG SURGERY  2004  . TENDON TRANSFER     Multiple, both legs    There were no vitals filed for this visit.   Subjective Assessment - 04/22/18 1437    Subjective  Pt presents for power wheelchair eval accompanied by his father; Josh Cadle, [employer] with [facility] present for eval    Patient is accompained by:  Family member   father   Pertinent History  CP; cervical spondylosis with myelopathy    Patient Stated Goals  obtain new power wheelchair    Currently in Pain?  No/denies         [facility] PT  Assessment - 04/22/18 0001      Assessment   Medical Diagnosis  Cerebral Palsy:  Cervical spondylosis with myelopathy     Referring Provider (PT)  Madison Hickman, MD    Onset Date/Surgical Date  --   Aug. 2014 for cervical decompression/fusion surgery     Precautions   Precautions  Fall      Balance Screen   Has the patient fallen in the past 6 months  No    Has the patient had a decrease in activity level because of a fear of falling?   No    Is the patient reluctant to leave their home because of a fear of falling?   No      Home Environment   Living Environment  Private residence    Type of [place] Access  Level entry    Home Layout  One level      Prior Function   Level of Independence  Independent with household mobility with device;Independent with community mobility with device;Needs assistance with ADLs;Requires assistive device for independence;Needs assistance with homemaking                Mobility/Seating Evaluation  PATIENT INFORMATION: Name: [name] DOB: [birth year]  Sex: M Date seen: 04-21-18 Time: 1315  Address:  [place], [place] [address] Physician: Madison Hickman, MD This evaluation/justification form will serve as the LMN for the following suppliers: __________________________ Supplier: [employer] Contact Person: Felton Clinton, Wess Botts Phone:  [phone]   Seating Therapist: Guido Sander, PT Phone:   [phone]   Phone: [phone]    Spouse/Parent/Caregiver name: ?????  Phone number: ????? Insurance/Payer: Medicare/Medicaid     Reason for Referral: power wheelchair eval  Patient/Caregiver Goals: obtain new power wheelchair  Patient was seen for face-to-face evaluation for new power wheelchair.  Also present was [employer], [employer] with [place] to discuss recommendations and wheelchair options.  Further paperwork was completed and sent to vendor.  Patient appears to qualify for power  mobility device at this time per objective findings.   MEDICAL HISTORY: Diagnosis: Primary Diagnosis: Cerebral palsy (infantile, unspecified) Onset: Congenital  Diagnosis: Cervical spondylosis with myelopathy C6-C7:  RLE edema;  h/o seizures   '[]'$ Progressive Disease Relevant past and future surgeries: Anterior cervical decompression/discectomy/fusion (09-26-12):  h/o previous cervical fusion (date unknown):  Rt hip fracture surgery (date unknown):  lung surgery 2004 due to collapsed lung   Height: 5'9" Weight: 220# Explain recent changes or trends in weight: ?????   History including Falls: Pt reports no falls within past 6 months; pt is nonambulatory - requires power wheelchair for independence with mobility; pt presents for power wheelchair evaluation in his current power chair which is 51 years old and in need of repairs    HOME ENVIRONMENT: '[]'$ House  '[]'$ Condo/town home  '[x]'$ Apartment  '[]'$ Assisted Living    '[]'$ Lives Alone '[x]'$  Lives with Others                                                                                          Hours with caregiver: 12  '[]'$ Home is accessible to patient           Stairs      '[]'$ Yes '[x]'$  No     Ramp '[]'$ Yes '[x]'$ No Comments:  ?????   COMMUNITY ADL: TRANSPORTATION: '[]'$ Car    '[]'$ Van    '[]'$ Public Transportation    '[x]'$ Adapted w/c Lift    '[]'$ Ambulance    '[]'$ Other:       '[x]'$ Sits in wheelchair during transport  Employment/School: ????? Specific requirements pertaining to mobility ?????  Other: ?????    FUNCTIONAL/SENSORY PROCESSING SKILLS:  Handedness:   '[x]'$ Right     '[]'$ Left    '[]'$ NA  Comments:  ?????  Functional Processing Skills for Wheeled Mobility '[x]'$ Processing Skills are adequate for safe wheelchair operation  Areas of concern than may interfere with safe operation of wheelchair Description of problem   '[]'$  Attention to environment      '[]'$ Judgment      '[]'$  Hearing  '[]'$  Vision or visual processing      '[]'$ Motor Planning  '[]'$  Fluctuations in Behavior  ?????    VERBAL  COMMUNICATION: '[x]'$ WFL receptive '[]'$  WFL expressive '[x]'$ Understandable  '[]'$ Difficult to understand  '[]'$   non-communicative '[]'$  Uses an augmented communication device  CURRENT SEATING / MOBILITY: Current Mobility Base:  '[]'$ None '[]'$ Dependent '[]'$ Manual '[]'$ Scooter '[x]'$ Power  Type of Control: Medical laboratory scientific officer:  Quantum RivalSize:  18x 20Age: 7  Current Condition of Mobility Base:  in disrepair   Current Wheelchair components:  ?????  Describe posture in present seating system:  ?????      SENSATION and SKIN ISSUES: Sensation '[x]'$ Intact  '[]'$ Impaired '[]'$ Absent  Level of sensation: ????? Pressure Relief: Able to perform effective pressure relief :    '[]'$ Yes  '[x]'$  No Method: ???? If not, Why?: Pt presents with decreased AROM in RUE & very minimal to no AROM in LUE due to flexion contracture; pt is unable to perform effective weight shift due to these AROM limitations and also due to decreased strength and decr. AROM in his bil. LE's; pt requires power tilt feature on wheelchair to independently and effectively perform weight shift   Skin Issues/Skin Integrity Current Skin Issues  '[]'$ Yes '[x]'$ No '[]'$ Intact '[]'$  Red area'[]'$  Open Area  '[]'$ Scar Tissue '[]'$ At risk from prolonged sitting Where  ?????  History of Skin Issues  '[]'$ Yes '[x]'$ No Where  ????? When  ?????  Hx of skin flap surgeries  '[]'$ Yes '[x]'$ No Where  ????? When  ?????  Limited sitting tolerance '[]'$ Yes '[x]'$ No Hours spent sitting in wheelchair daily: 12+  Complaint of Pain:  Please describe: None   Swelling/Edema: Edema in Rt foot and ankle - reason unknown; pt states he has had moderate to severe swelling in his RLE for approx. past 7-8 yrs; states MD unable to determine cause of the edema   ADL STATUS (in reference to wheelchair use):  Indep Assist Unable Indep with Equip Not assessed Comments  Dressing ????? X ????? ????? ????? pt unable to don socks; dresses from wheelchair  Eating ????? X ????? ????? ????? needs assistance with cutting foods  Toileting  ????? ????? ????? X ????? is in handicapped accessible apartment   Bathing ????? ????? ????? X ????? uses shower chair; pt reports independence with transfers but PT feels that supervision is needed for safety due to pt's high risk for fall    Grooming/Hygiene ????? ????? ????? X ????? stands at bathroom sink - uses it to assist with balance/support  Meal Prep ????? X ????? X ????? performs light meal prep only from power wheelchair    IADLS ????? ????? ????? X ????? uses power wheelchair in community  Bowel Management: '[x]'$ Continent  '[]'$ Incontinent  '[]'$ Accidents Comments:  ?????  Bladder Management: '[]'$ Continent  '[]'$ Incontinent  '[x]'$ Accidents Comments:  occasional urgency incontinence     WHEELCHAIR SKILLS: Manual w/c Propulsion: '[]'$ UE or LE strength and endurance sufficient to participate in ADLs using manual wheelchair Arm : '[]'$ left '[]'$ right   '[]'$ Both      Distance: ????? Foot:  '[]'$ left '[]'$ right   '[]'$ Both  Operate Scooter: '[]'$  Strength, hand grip, balance and transfer appropriate for use '[]'$ Living environment is accessible for use of scooter  Operate Power w/c:  '[x]'$  Std. Joystick   '[]'$  Alternative Controls Indep '[x]'$  Assist '[x]'$  Dependent/unable '[]'$  N/A '[]'$   '[x]'$ Safe          '[x]'$  Functional      Distance: 100'+  Bed confined without wheelchair '[x]'$  Yes '[]'$  No   STRENGTH/RANGE OF MOTION:  Active/Passive Range of Motion Strength  Shoulder Rt shoulder flexion 64 degrees: Rt shoulder abduction 46 degrees:  Lt shoulder flexion 52 degrees with very minimal isolated Lt shoulder abduction due to flexion contracture Rt shoulder flexors 2+/5;  Rt abductors 2+/5 Lt shoulder flexors 2+/5;  Lt abductors 1+ - 2-/5  Elbow WFL's bil. UE's WFL's bil. elbow flexors & extensors  Wrist/Hand Rt wrist flexion & extension WFL's Lt wrist active flexion and extension approx. 50% AROM - pt wearing wrist brace on LUE to maintain in neutral position Rt wrist flexors & extensors 4/5 Lt wrist flexors 3+/5: extensors 2 - 2+/5 Rt  finger flexors WFL's; Lt fingers are held in flexion but pt able to partially extend   Hip No active Rt hip flexion in seated position:  passive Rt hip flexion WFL's:  Lt hip flexion WFL's actively Rt hip flexors 2-/5; Lt hip flexors 3+/5  Knee WFL's bil. knee extension; pt able to flex knees to 90 degrees in seated position bil. quads 4/5:  Rt hamstrings 3-/5;  Lt hamstrings 3+/5  Ankle WFL's for dorsiflexion and plantarflexion Rt dorsiflexors 3+/5:  Lt dorsiflexors 4+/5     MOBILITY/BALANCE:  '[]'$  Patient is totally dependent for mobility  ?????    Balance Transfers Ambulation  Sitting Balance: Standing Balance: '[]'$  Independent '[]'$  Independent/Modified Independent  '[x]'$  WFL     '[]'$  WFL '[]'$  Supervision '[]'$  Supervision  '[]'$  Uses UE for balance  '[]'$  Supervision '[]'$  Min Assist '[]'$  Ambulates with Assist  ?????    '[]'$  Min Assist '[]'$  Min assist '[x]'$  Mod Assist '[]'$  Ambulates with Device:      '[]'$  RW  '[]'$  StW  '[]'$  Cane  '[]'$  ?????  '[]'$  Mod Assist '[]'$  Mod assist '[]'$  Max assist   '[]'$  Max Assist '[x]'$  Max assist '[]'$  Dependent '[]'$  Indep. Short Distance Only  '[]'$  Unable '[]'$  Unable '[]'$  Lift / Sling Required Distance (in feet)  ?????   '[]'$  Sliding board '[x]'$  Unable to Ambulate (see explanation below)  Cardio Status:  '[x]'$ Intact  '[]'$  Impaired   '[]'$  NA     ?????  Respiratory Status:  '[]'$ Intact   '[x]'$ Impaired   '[]'$ NA     h/o Lt collapsed lung 2004;  mild intermittent chronic asthma  Orthotics/Prosthetics: None  Comments (Address manual vs power w/c vs scooter): Pt is unable to propel a manual wheelchair due to decreased AROM in bil. UE's and also due to flexion contracture of Lt shoulder musc. with min. PROM of shoulder abduction.  Pt presents with decreased strength in bil. UE's with LUE more impaired than RUE (pt holds Lt fingers in flexed position and wears resting hand splint to hold Lt wrist in neutral position).  Pt also would be unable to propel a manual wheelchair due to his compromised respiratory system with pt s/p collpased lung in 2004.   Pt is unable to stand unsupported and is nonambulatory.  Pt is at very high risk for falls due to standing balance deficits, decreased strength and AROM in his bil. UE's and LE's.  Pt requires a power wheelchair with power tilt and recline for independence with mobility and with weight shifting in order to perform adequate pressure relief to prevent skin breakdown,  Pt is unable to operate a scooter due to his decreased functional use of bil. UE's with decr. AROM and strength; pt would be unable to operate the tiller of a scooter.  Pt would also be unable to transfer on and off the platform of a scooter.          Anterior / Posterior Obliquity Rotation-Pelvis ?????  PELVIS    '[]'$  '[x]'$  '[]'$   Neutral Posterior Anterior  '[]'$  '[x]'$  '[]'$   WFL Rt elev Lt elev  '[x]'$  '[]'$  '[]'$   WFL Right Left                      Anterior    Anterior     '[]'$  Fixed '[]'$  Other '[]'$  Partly Flexible '[x]'$  Flexible   '[]'$  Fixed '[]'$  Other '[x]'$  Partly Flexible  '[]'$  Flexible  '[]'$  Fixed '[]'$  Other '[]'$  Partly Flexible  '[x]'$  Flexible   TRUNK  '[]'$  '[x]'$  '[]'$   WFL ? Thoracic ? Lumbar  Kyphosis Lordosis  '[x]'$  '[]'$  '[]'$   WFL Convex Convex  Right Left '[]'$ c-curve '[]'$ s-curve '[]'$ multiple  '[]'$  Neutral '[]'$  Left-anterior '[]'$  Right-anterior     '[]'$  Fixed '[]'$  Flexible '[x]'$  Partly Flexible '[]'$  Other  '[]'$  Fixed '[]'$  Flexible '[x]'$  Partly Flexible '[]'$  Other  '[]'$  Fixed             '[]'$  Flexible '[]'$  Partly Flexible '[]'$  Other    Position Windswept  ?????  HIPS          '[x]'$            '[]'$               '[]'$    Neutral       Abduct        ADduct         '[x]'$           '[]'$            '[]'$   Neutral Right           Left      '[]'$  Fixed '[]'$  Subluxed '[]'$  Partly Flexible '[]'$  Dislocated '[x]'$  Flexible  '[]'$  Fixed '[]'$  Other '[]'$  Partly Flexible  '[x]'$  Flexible                 Foot Positioning Knee Positioning  ?????    '[x]'$  WFL  '[x]'$ Lt '[x]'$ Rt '[x]'$  WFL  '[x]'$ Lt '[x]'$ Rt    KNEES ROM concerns: ROM concerns:    & Dorsi-Flexed '[]'$ Lt '[]'$ Rt ?????    FEET Plantar Flexed '[]'$ Lt '[]'$ Rt      Inversion                 '[]'$ Lt '[]'$ Rt       Eversion                 '[]'$ Lt '[]'$ Rt     HEAD '[]'$  Functional '[]'$  Good Head Control  limited active and passive cervical rotation due to s/p cervical fusion  & '[x]'$  Flexed         '[]'$  Extended '[x]'$  Adequate Head Control    NECK '[]'$  Rotated  Lt  '[]'$  Lat Flexed Lt '[]'$  Rotated  Rt '[]'$  Lat Flexed Rt '[]'$  Limited Head Control     '[]'$  Cervical Hyperextension '[]'$  Absent  Head Control     SHOULDERS ELBOWS WRIST& HAND pt holds left fingers in flexed position but able to partially actively extend      Left     Right    Left     Right    Left     Right   U/E '[]'$ Functional           '[x]'$ Functional WNL's WNL's '[x]'$ Fisting             '[]'$ Fisting      '[x]'$ elev   '[]'$ dep      '[]'$ elev   '[]'$ dep       '[]'$ pro -'[]'$ retract     '[]'$ pro  '[]'$ retract '[]'$ subluxed             '[]'$ subluxed  Goals for Wheelchair Mobility  '[x]'$  Independence with mobility in the home with motor related ADLs (MRADLs)  '[]'$  Independence with MRADLs in the community '[]'$  Provide dependent mobility  '[x]'$  Provide recline     '[x]'$ Provide tilt   Goals for Seating system '[x]'$  Optimize pressure distribution '[x]'$  Provide support needed to facilitate function or safety '[]'$  Provide corrective forces to assist with maintaining or improving posture '[]'$  Accommodate client's posture:   current seated postures and positions are not flexible or will not tolerate corrective forces '[x]'$  Client to be independent with relieving pressure in the wheelchair '[]'$ Enhance physiological function such as breathing, swallowing, digestion  Simulation ideas/Equipment trials:????? State why other equipment was unsuccessful:?????   MOBILITY BASE RECOMMENDATIONS and JUSTIFICATION: MOBILITY COMPONENT JUSTIFICATION  Manufacturer: Quantum Model: Rival    Size: Width 18Seat Depth 20 '[x]'$ provide transport from point A to B      '[x]'$ promote Indep mobility  '[x]'$ is not a safe, functional ambulator '[x]'$ walker or cane inadequate '[]'$ non-standard width/depth necessary to accommodate anatomical measurement '[]'$  ?????   '[]'$ Manual Mobility Base '[]'$ non-functional ambulator    '[]'$ Scooter/POV  '[]'$ can safely operate  '[]'$ can safely transfer   '[]'$ has adequate trunk stability  '[]'$ cannot functionally propel manual w/c  '[x]'$ Power Mobility Base  '[x]'$ non-ambulatory  '[x]'$ cannot functionally propel manual wheelchair  '[x]'$  cannot functionally and safely operate scooter/POV '[x]'$ can safely operate and willing to  '[]'$ Stroller Base '[]'$ infant/child  '[]'$ unable to propel manual wheelchair '[]'$ allows for growth '[]'$ non-functional ambulator '[]'$ non-functional UE '[]'$ Indep mobility is not a goal at this time  '[x]'$ Tilt  '[]'$ Forward '[x]'$ Backward '[x]'$ Powered tilt  '[]'$ Manual tilt  '[x]'$ change position against gravitational force on head and shoulders  '[x]'$ change position for pressure relief/cannot weight shift '[]'$ transfers  '[]'$ management of tone '[x]'$ rest periods '[x]'$ control edema '[x]'$ facilitate postural control  '[]'$  ?????  '[x]'$ Recline  '[x]'$ Power recline on power base '[]'$ Manual recline on manual base  '[]'$ accommodate femur to back angle  '[]'$ bring to full recline for ADL care  '[x]'$ change position for pressure relief/cannot weight shift '[x]'$ rest periods '[]'$ repositioning for transfers or clothing/diaper /catheter changes '[]'$ head positioning  '[]'$ Lighter weight required '[]'$ self- propulsion  '[]'$ lifting '[]'$  ?????  '[]'$ Heavy Duty required '[]'$ user weight greater than 250# '[]'$ extreme tone/ over active movement '[]'$ broken frame on previous chair '[]'$  ?????  '[x]'$  Back  '[]'$  Angle Adjustable '[]'$  Custom molded Tru Comfort 2  '[x]'$ postural control '[]'$ control of tone/spasticity '[]'$ accommodation of range of motion '[]'$ UE functional control '[]'$ accommodation for seating system '[]'$  ????? '[]'$ provide lateral trunk support '[]'$ accommodate deformity '[x]'$ provide posterior trunk support '[x]'$ provide lumbar/sacral support '[x]'$ support trunk in midline '[x]'$ Pressure relief over spinal processes  '[x]'$  Seat Cushion Tru Comfort 2 '[]'$ impaired sensation  '[]'$ decubitus ulcers present '[]'$ history of pressure  ulceration '[x]'$ prevent pelvic extension '[x]'$ low maintenance  '[x]'$ stabilize pelvis  '[]'$ accommodate obliquity '[]'$ accommodate multiple deformity '[x]'$ neutralize lower extremity position '[x]'$ increase pressure distribution '[x]'$  at risk for skin breakdown with prolonged sitting  '[]'$  Pelvic/thigh support  '[]'$  Lateral thigh guide '[]'$  Distal medial pad  '[]'$  Distal lateral pad '[]'$  pelvis in neutral '[]'$ accommodate pelvis '[]'$  position upper legs '[]'$  alignment '[]'$  accommodate ROM '[]'$  decr adduction '[]'$ accommodate tone '[]'$ removable for transfers '[]'$ decr abduction  '[]'$  Lateral trunk Supports '[]'$  Lt     '[]'$  Rt '[]'$ decrease lateral trunk leaning '[]'$ control tone '[]'$ contour for increased contact '[]'$ safety  '[]'$ accommodate asymmetry '[]'$  ?????  '[x]'$  Mounting hardware  '[]'$ lateral trunk supports  '[x]'$ back   '[x]'$ seat '[x]'$ headrest      '[]'$  thigh support '[]'$ fixed   '[]'$ swing away '[x]'$ attach seat platform/cushion to w/c frame '[x]'$ attach back cushion to w/c frame '[]'$ mount postural supports '[x]'$ mount headrest  '[]'$ swing  medial thigh support away '[]'$ swing lateral supports away for transfers  '[]'$  ?????    Armrests  '[]'$ fixed '[x]'$ adjustable height '[]'$ removable   '[]'$ swing away  '[x]'$ flip back   '[]'$ reclining '[]'$ full length pads '[x]'$ desk    '[]'$ pads tubular  '[x]'$ provide support with elbow at 90   '[]'$ provide support for w/c tray '[x]'$ change of height/angles for variable activities '[]'$ remove for transfers '[x]'$ allow to come closer to table top '[]'$ remove for access to tables '[]'$  ?????  Hangers/ Leg rests  '[]'$ 60 '[]'$ 70 '[]'$ 90 '[x]'$ elevating '[]'$ heavy duty  '[x]'$ articulating '[]'$ fixed '[]'$ lift off '[]'$ swing away     '[x]'$ power '[x]'$ provide LE support  '[]'$ accommodate to hamstring tightness '[x]'$ elevate legs during recline   '[x]'$ provide change in position for Legs '[]'$ Maintain placement of feet on footplate '[]'$ durability '[x]'$ enable transfers '[x]'$ decrease edema '[]'$ Accommodate lower leg length '[]'$  ?????  Foot support Footplate    '[]'$ Lt  '[]'$  Rt  '[x]'$  Center mount '[x]'$ flip up     '[x]'$ depth/angle  adjustable '[]'$ Amputee adapter    '[]'$  Lt     '[]'$  Rt '[x]'$ provide foot support '[x]'$ accommodate to ankle ROM '[x]'$ transfers '[]'$ Provide support for residual extremity '[]'$  allow foot to go under wheelchair base '[]'$  decrease tone  '[]'$  ?????  '[]'$  Ankle strap/heel loops '[]'$ support foot on foot support '[]'$ decrease extraneous movement '[]'$ provide input to heel  '[]'$ protect foot  Tires: '[]'$ pneumatic  '[x]'$ flat free inserts  '[]'$ solid  '[x]'$ decrease maintenance  '[x]'$ prevent frequent flats '[]'$ increase shock absorbency '[]'$ decrease pain from road shock '[]'$ decrease spasms from road shock '[]'$  ?????  '[x]'$  Headrest  '[]'$ provide posterior head support '[]'$ provide posterior neck support '[]'$ provide lateral head support '[]'$ provide anterior head support '[]'$ support during tilt and recline '[]'$ improve feeding   '[]'$ improve respiration '[]'$ placement of switches '[]'$ safety  '[]'$ accommodate ROM  '[]'$ accommodate tone '[]'$ improve visual orientation  '[]'$  Anterior chest strap '[]'$  Vest '[]'$  Shoulder retractors  '[]'$ decrease forward movement of shoulder '[]'$ accommodation of TLSO '[]'$ decrease forward movement of trunk '[]'$ decrease shoulder elevation '[]'$ added abdominal support '[]'$ alignment '[]'$ assistance with shoulder control  '[]'$  ?????  Pelvic Positioner '[x]'$ Belt '[]'$ SubASIS bar '[]'$ Dual Pull '[]'$ stabilize tone '[x]'$ decrease falling out of chair/ **will not Decr potential for sliding due to pelvic tilting '[]'$ prevent excessive rotation '[x]'$ pad for protection over boney prominence '[]'$ prominence comfort '[]'$ special pull angle to control rotation '[]'$  ?????  Upper Extremity Support '[]'$ L   '[]'$  R '[]'$ Arm trough    '[]'$ hand support '[]'$  tray       '[]'$ full tray '[]'$ swivel mount '[]'$ decrease edema      '[]'$ decrease subluxation   '[]'$ control tone   '[]'$ placement for AAC/Computer/EADL '[]'$ decrease gravitational pull on shoulders '[]'$ provide midline positioning '[]'$ provide support to increase UE function '[]'$ provide hand support in natural position '[]'$ provide work surface   POWER WHEELCHAIR CONTROLS   '[x]'$ Proportional  '[]'$ Non-Proportional Type Joystick '[]'$ Left  '[x]'$ Right '[x]'$ provides access for controlling wheelchair   '[]'$ lacks motor control to operate proportional drive control '[]'$ unable to understand proportional controls  Actuator Control Module  '[]'$ Single  '[x]'$ Multiple   '[x]'$ Allow the client to operate the power seat function(s) through the joystick control   '[]'$ Safety Reset Switches '[]'$ Used to change modes and stop the wheelchair when driving in latch mode    '[x]'$ Upgraded Electronics   '[]'$ programming for accurate control '[]'$ progressive Disease/changing condition '[]'$ non-proportional drive control needed '[x]'$ Needed in order to operate power seat functions through joystick control   '[]'$ Display box '[]'$ Allows user to see in which mode and drive the wheelchair is set  '[]'$ necessary for alternate controls    '[]'$ Digital interface electronics '[]'$ Allows w/c to operate when using alternative drive controls  '[]'$ ASL Head Array '[]'$ Allows client to operate wheelchair  through switches placed in tri-panel headrest  '[]'$ Sip and puff with tubing kit '[]'$ needed to operate sip and puff drive controls  '[]'$ Upgraded tracking electronics '[]'$ increase safety when driving '[]'$ correct tracking when on uneven surfaces  '[]'$ Mount for switches or joystick '[]'$ Attaches switches to w/c  '[]'$ Swing away for access or transfers '[]'$ midline for optimal placement '[]'$ provides for consistent access  '[]'$ Attendant controlled joystick plus mount '[]'$ safety '[]'$ long distance driving '[]'$ operation of seat functions '[]'$ compliance with transportation regulations '[]'$  ?????    Rear wheel placement/Axle adjustability '[]'$ None '[]'$ semi adjustable '[]'$ fully adjustable  '[]'$ improved UE access to wheels '[]'$ improved stability '[]'$ changing angle in space for improvement of postural stability '[]'$ 1-arm drive access '[]'$ amputee pad placement '[]'$  ?????  Wheel rims/ hand rims  '[]'$ metal  '[]'$ plastic coated '[]'$ oblique projections '[]'$ vertical projections '[]'$ Provide ability to propel manual wheelchair   '[]'$  Increase self-propulsion with hand weakness/decreased grasp  Push handles '[]'$ extended  '[]'$ angle adjustable  '[]'$ standard '[]'$ caregiver access '[]'$ caregiver assist '[]'$ allows "hooking" to enable increased ability to perform ADLs or maintain balance  One armed device  '[]'$ Lt   '[]'$ Rt '[]'$ enable propulsion of manual wheelchair with one arm   '[]'$  ?????   Brake/wheel lock extension '[]'$  Lt   '[]'$  Rt '[]'$ increase indep in applying wheel locks   '[]'$ Side guards '[]'$ prevent clothing getting caught in wheel or becoming soiled '[]'$  prevent skin tears/abrasions  Battery: Group 24 x 2 '[x]'$ to power wheelchair ?????  Other: Incontinence cover for cushion For hygiene and skin protection from bodily fluids due to 0incontinence  ?????  The above equipment has a life- long use expectancy. Growth and changes in medical and/or functional conditions would be the exceptions. This is to certify that the therapist has no financial relationship with durable medical provider or manufacturer. The therapist will not receive remuneration of any kind for the equipment recommended in this evaluation.   Patient has mobility limitation that significantly impairs safe, timely participation in one or more mobility related ADL's.  (bathing, toileting, feeding, dressing, grooming, moving from room to room)                                                             '[x]'$  Yes '[]'$  No Will mobility device sufficiently improve ability to participate and/or be aided in participation of MRADL's?         '[x]'$  Yes '[]'$  No Can limitation be compensated for with use of a cane or walker?                                                                                '[]'$  Yes '[x]'$  No Does patient or caregiver demonstrate ability/potential ability & willingness to safely use the mobility device?   '[x]'$  Yes '[]'$  No Does patient's home environment support use of recommended mobility device?                                                    [  x] Yes '[]'$  No Does patient have sufficient upper  extremity function necessary to functionally propel a manual wheelchair?    '[]'$  Yes '[x]'$  No Does patient have sufficient strength and trunk stability to safely operate a POV (scooter)?                                  '[]'$  Yes '[x]'$  No Does patient need additional features/benefits provided by a power wheelchair for MRADL's in the home?       '[x]'$  Yes '[]'$  No Does the patient demonstrate the ability to safely use a power wheelchair?                                                              '[x]'$  Yes '[]'$  No  Therapist Name Printed: Guido Sander, PT Date: 04-21-18  Therapist's Signature:   Date:   Supplier's Name Printed: Felton Clinton, [employer] Date: 04-21-18  Supplier's Signature:   Date:  Patient/Caregiver Signature:   Date:     This is to certify that I have read this evaluation and do agree with the content within:      Physician's Name Printed: Madison Hickman, MD  17 Signature:  Date:     This is to certify that I, the above signed therapist have the following affiliations: '[]'$  This DME provider '[]'$  Manufacturer of recommended equipment '[]'$  Patient's long term care facility '[x]'$  None of the above                             Plan - 04/22/18 1443    Clinical Impression Statement  Pt seen for power wheelchair eval with Josh Cadle, ATP with [facility]; recommend power wheelchair (Quantum Rival) with power tilt and recline for independence with pressure relief     PT Frequency  One time visit   eval only   PT Treatment/Interventions  Other (comment)   wheelchair management   PT Next Visit Plan  N/A - wheelchair eval only    Recommended Other Services  obtain power wheelchair from [facility] and Agree with Plan of Care  Patient;Family member/caregiver    Family Member Consulted  father       Patient will benefit from skilled therapeutic intervention in order to improve the following deficits and impairments:  Decreased strength, Decreased mobility,  Decreased balance, Impaired UE functional use, Impaired flexibility  Visit Diagnosis: Cerebral palsy, unspecified type ([place]) - Plan: PT plan of care cert/re-cert  Other abnormalities of gait and mobility - Plan: PT plan of care cert/re-cert     Problem List Patient Active Problem List   Diagnosis Date Noted  . Viral URI with cough 07/01/2017  . Chronic diarrhea 12/31/2015  . Elevated bilirubin 06/19/2015  . Elevated alkaline phosphatase level 06/10/2015  . Migraine 08/25/2013  . Seizures ([place]) 08/25/2013  . Herniated nucleus pulposus, C6-7 09/26/2012  . 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 Numbness and tingling of right leg 05/23/2012  . Mild intermittent chronic asthma without complication 25/95/6387  . Lower extremity edema 09/01/2010  . CEREBRAL PALSY 04/15/2006    DildayJenness Corner, PT 04/22/2018, 2:49 PM  [facility]  [place] [address] [place], [place], [address] Phone: [phone]   Fax:  [phone]  Name: STAFFORD RIVIERA MRN: [id number] Date of Birth: 10-Mar-1965

## 2018-06-13 ENCOUNTER — Other Ambulatory Visit: Payer: Self-pay | Admitting: Family Medicine

## 2018-06-29 ENCOUNTER — Other Ambulatory Visit: Payer: Self-pay

## 2018-06-29 ENCOUNTER — Telehealth (INDEPENDENT_AMBULATORY_CARE_PROVIDER_SITE_OTHER): Payer: Medicare Other | Admitting: Family Medicine

## 2018-06-29 ENCOUNTER — Ambulatory Visit: Payer: Medicare Other | Admitting: Family Medicine

## 2018-06-29 DIAGNOSIS — G809 Cerebral palsy, unspecified: Secondary | ICD-10-CM | POA: Diagnosis not present

## 2018-06-29 NOTE — Progress Notes (Signed)
BP- N/A WT- 210LB T-N/A

## 2018-06-29 NOTE — Assessment & Plan Note (Signed)
Patient with a past history significant for cerebral palsy as well as cervical spondylosis.  Patient has been wheelchair-bound for many years and has had increased difficulty for ADL given disability.  Patient has been evaluated for a power wheelchair today.  Patient is unable to stand without assistance.  He is only able to use right hand for ADL.  Patient is unable to use his left hand.  Being in a regular wheelchair has made ADL very difficult to perform.  Power chair will also help with tilting and shifting decreasing chance for decubitus ulcer.  I have read physical therapy evaluation and concur with their evaluation.  Patient is alert and oriented x4, is able to discuss with provider reason for requesting a power wheelchair.  I do not see any limitation from the mental capacity standpoint or safety concerns in relation to patient operating a power wheelchair.

## 2018-06-29 NOTE — Progress Notes (Signed)
[facility] Telemedicine Visit  Patient consented to have virtual visit. Method of visit: Telephone  Encounter participants: Patient: [name] - located at Home Provider: Lovena Neighbours - located at [facility] Others (if applicable): Father present during the conversation  Chief Complaint: Need for power wheelchair  HPI: Patient is a 53 year old male with a past medical history significant for congenital cerebral palsy with decompression surgery for his cervical spondylosis and herniated disc was been using a wheelchair for many years.  Patient unable to visit clinic today due to COVID-19 and him being at high risk.  Patient reports that he is able to for ADL but it has been increasingly more difficult since he is only able to use his right hand.  Patient able to stand only with assistance but will not do that since fall is very high risk.  Patient desire power wheelchair to help remove cleaning, toileting and cooking. Patient denies any recent falls.  ROS: per HPI  Pertinent PMHx: Congenital cerebral palsy, cervical spondylosis status post  Exam: Weight 210 pounds, Height 5\' 9"  Cardiac: No palpitations Respiratory: No increased work of breathing Abdominal: No abdominal pain Neuro: Alert and oriented x4  Assessment/Plan:  Infantile cerebral palsy (HCC) Patient with a past history significant for cerebral palsy as well as cervical spondylosis.  Patient has been wheelchair-bound for many years and has had increased difficulty for ADL given disability.  Patient has been evaluated for a power wheelchair today.  Patient is unable to stand without assistance.  He is only able to use right hand for ADL.  Patient is unable to use his left hand.  Being in a regular wheelchair has made ADL very difficult to perform.  Power chair will also help with tilting and shifting decreasing chance for decubitus ulcer.  I have read physical therapy evaluation and concur with their  evaluation.  Patient is alert and oriented x4, is able to discuss with provider reason for requesting a power wheelchair.  I do not see any limitation from the mental capacity standpoint or safety concerns in relation to patient operating a power wheelchair.    Time spent during visit with patient: 8 minutes

## 2018-10-19 ENCOUNTER — Other Ambulatory Visit: Payer: Self-pay | Admitting: Family Medicine

## 2018-10-19 MED ORDER — LOPERAMIDE HCL 2 MG PO CAPS: 2.0000 mg | ORAL_CAPSULE | Freq: Every day | ORAL | 2 refills | Status: DC

## 2018-12-20 ENCOUNTER — Telehealth: Payer: Self-pay | Admitting: Family Medicine

## 2018-12-20 NOTE — Telephone Encounter (Signed)
Garbage pick up form dropped off for at front desk for completion.  Verified that patient section of form has been completed.  Last DOS/WCC with PCP was 06/29/2018.  Placed form in team folder to be completed by clinical staff.  Crista Luria

## 2018-12-20 NOTE — Telephone Encounter (Signed)
Clinical info completed on Garbage Pick Up form.  Place form in PCP's box for completion.  Salvatore Marvel, CMA

## 2018-12-26 NOTE — Telephone Encounter (Signed)
Patients mother calling nurse line to check the status of form.

## 2018-12-28 NOTE — Telephone Encounter (Signed)
Patients mother calls again in regards to form. I spoke with PCP who apologized for delay. PCP stated she will sign form and bring back over this afternoon. Mother informed.

## 2018-12-28 NOTE — Telephone Encounter (Signed)
Mother informed of form ready for pick up.  °

## 2019-02-21 ENCOUNTER — Other Ambulatory Visit: Payer: Self-pay | Admitting: *Deleted

## 2019-02-21 DIAGNOSIS — J4520 Mild intermittent asthma, uncomplicated: Secondary | ICD-10-CM

## 2019-02-23 ENCOUNTER — Other Ambulatory Visit: Payer: Self-pay | Admitting: Family Medicine

## 2019-02-23 DIAGNOSIS — G809 Cerebral palsy, unspecified: Secondary | ICD-10-CM

## 2019-02-23 MED ORDER — LEVETIRACETAM 500 MG PO TABS: ORAL_TABLET | ORAL | 1 refills | Status: DC

## 2019-02-23 MED ORDER — ALBUTEROL SULFATE HFA 108 (90 BASE) MCG/ACT IN AERS: INHALATION_SPRAY | RESPIRATORY_TRACT | 11 refills | Status: DC

## 2019-02-23 MED ORDER — FLOVENT HFA 110 MCG/ACT IN AERO: 2.0000 | INHALATION_SPRAY | Freq: Two times a day (BID) | RESPIRATORY_TRACT | 3 refills | Status: DC

## 2019-03-13 ENCOUNTER — Other Ambulatory Visit: Payer: Self-pay | Admitting: Family Medicine

## 2019-03-13 ENCOUNTER — Telehealth: Payer: Self-pay

## 2019-03-13 DIAGNOSIS — J4520 Mild intermittent asthma, uncomplicated: Secondary | ICD-10-CM

## 2019-03-13 MED ORDER — ALBUTEROL SULFATE HFA 108 (90 BASE) MCG/ACT IN AERS: 1.0000 | INHALATION_SPRAY | Freq: Four times a day (QID) | RESPIRATORY_TRACT | 3 refills | Status: DC | PRN

## 2019-03-13 NOTE — Progress Notes (Signed)
Medicare requesting change in Albuterol inhaler to Proair. Change was made and new prescription filled out.  Peggyann Shoals, DO [facility], PGY-2 03/13/2019 4:47 PM

## 2019-03-13 NOTE — Telephone Encounter (Signed)
Patient's mother calls nurse line regarding insurance issues with albuterol inhaler. Per pt's mother, insurance company is going to stop covering albuterol, however, she was given a list of other options that could be used in place of this inhaler.  PRN Inhaler Options covered by insurance - Proair HSA  -Ventolin HSA -Serevent Diskus  Please advise if any of these medications are appropriate switches or if we should pursue a Prior Auth.   To PCP  Veronda Prude, RN

## 2019-04-03 ENCOUNTER — Telehealth: Payer: Self-pay

## 2019-04-03 NOTE — Telephone Encounter (Signed)
Patients mother calls for two reasons. (1) patient has been summoned for jury duty on March 9th. Mother is requesting a doctors note to excuse him. (2) patient needs his handicap placard filled out. This has been placed in providers box. Please advise.

## 2019-04-10 NOTE — Telephone Encounter (Signed)
Patients mother calls nurse line again requesting letter for jury duty excuse. Patients mother needs this ASAP.

## 2019-04-11 NOTE — Telephone Encounter (Signed)
Patient's mother informed

## 2019-04-24 ENCOUNTER — Telehealth: Payer: Self-pay

## 2019-04-24 NOTE — Telephone Encounter (Signed)
Hope with [facility] calls nurse line regarding clarification on inhaler rx. Hope states that medicare will not cover the proair inhaler and that medicare will only cover the ventolin inhaler. She also requests clarification on the instructions of the inhalers. Ventolin inhaler previously was written for 2 puffs every 4 hours as needed. The proair inhaler is written for 1-2 puffs every 6 hours as needed.   Please clarify which instructions should be written for inhaler and if we can switch from proair to ventolin per insurance coverage.   Veronda Prude, RN

## 2019-04-25 NOTE — Telephone Encounter (Signed)
Thomas (pharmacist), LVM on nurse line to check on status of receiving clarification for inhalers. See below message from [facility].   To PCP  Please advise  Veronda Prude, RN

## 2019-04-26 MED ORDER — ALBUTEROL SULFATE HFA 108 (90 BASE) MCG/ACT IN AERS: INHALATION_SPRAY | RESPIRATORY_TRACT | 3 refills | Status: DC

## 2019-04-26 NOTE — Telephone Encounter (Signed)
Spoke with Olin Hauser and gave verbal okay to change to ventolin inhaler.   Pharmacy requested to dispense 3 inhalers with 3 refills. Confirmed with PCP. Verbal orders given.   To PCP  Veronda Prude, RN

## 2019-06-21 ENCOUNTER — Other Ambulatory Visit: Payer: Self-pay | Admitting: *Deleted

## 2019-06-21 MED ORDER — LOPERAMIDE HCL 2 MG PO CAPS: 2.0000 mg | ORAL_CAPSULE | Freq: Every day | ORAL | 2 refills | Status: DC

## 2019-06-22 ENCOUNTER — Other Ambulatory Visit: Payer: Self-pay | Admitting: *Deleted

## 2019-06-22 DIAGNOSIS — G809 Cerebral palsy, unspecified: Secondary | ICD-10-CM

## 2019-06-23 MED ORDER — LEVETIRACETAM 500 MG PO TABS: ORAL_TABLET | ORAL | 3 refills | Status: DC

## 2019-09-18 ENCOUNTER — Other Ambulatory Visit: Payer: Self-pay

## 2019-09-18 ENCOUNTER — Ambulatory Visit (INDEPENDENT_AMBULATORY_CARE_PROVIDER_SITE_OTHER): Payer: Medicare Other | Admitting: Family Medicine

## 2019-09-18 ENCOUNTER — Encounter: Payer: Self-pay | Admitting: Family Medicine

## 2019-09-18 VITALS — BP 130/82 | HR 92

## 2019-09-18 DIAGNOSIS — R739 Hyperglycemia, unspecified: Secondary | ICD-10-CM

## 2019-09-18 DIAGNOSIS — Z114 Encounter for screening for human immunodeficiency virus [HIV]: Secondary | ICD-10-CM | POA: Diagnosis not present

## 2019-09-18 DIAGNOSIS — E039 Hypothyroidism, unspecified: Secondary | ICD-10-CM

## 2019-09-18 DIAGNOSIS — Z1159 Encounter for screening for other viral diseases: Secondary | ICD-10-CM

## 2019-09-18 DIAGNOSIS — R2241 Localized swelling, mass and lump, right lower limb: Secondary | ICD-10-CM

## 2019-09-18 LAB — POCT GLYCOSYLATED HEMOGLOBIN (HGB A1C): Hemoglobin A1C: 4.4 % (ref 4.0–5.6)

## 2019-09-18 NOTE — Patient Instructions (Addendum)
I will call you with results of your lab work - we are screening for diabetes and thyroid function, looking at kidney function, electrolytes, liver function, and doing a routine HIV and Hep C screening.    Wear your ace bandage/wrap starting every morning.   Peggyann Shoals, DO [facility], PGY-3 09/18/2019 4:52 PM

## 2019-09-19 LAB — HEPATITIS C ANTIBODY: Hep C Virus Ab: <0.1 s/co ratio (ref 0.0–0.9)

## 2019-09-19 LAB — COMPREHENSIVE METABOLIC PANEL
ALT: 27 IU/L (ref 0–44)
AST: 25 IU/L (ref 0–40)
Albumin/Globulin Ratio: 2.7 — ABNORMAL HIGH (ref 1.2–2.2)
Albumin: 4.9 g/dL (ref 3.8–4.9)
Alkaline Phosphatase: 139 IU/L — ABNORMAL HIGH (ref 48–121)
BUN/Creatinine Ratio: 21 — ABNORMAL HIGH (ref 9–20)
BUN: 14 mg/dL (ref 6–24)
Bilirubin Total: 1.5 mg/dL — ABNORMAL HIGH (ref 0.0–1.2)
CO2: 25 mmol/L (ref 20–29)
Calcium: 10 mg/dL (ref 8.7–10.2)
Chloride: 99 mmol/L (ref 96–106)
Creatinine, Ser: 0.68 mg/dL — ABNORMAL LOW (ref 0.76–1.27)
GFR calc Af Amer: 128 mL/min/{1.73_m2} (ref >59)
GFR calc non Af Amer: 111 mL/min/{1.73_m2} (ref >59)
Globulin, Total: 1.8 g/dL (ref 1.5–4.5)
Glucose: 88 mg/dL (ref 65–99)
Potassium: 3.9 mmol/L (ref 3.5–5.2)
Sodium: 140 mmol/L (ref 134–144)
Total Protein: 6.7 g/dL (ref 6.0–8.5)

## 2019-09-19 LAB — TSH: TSH: 2.11 u[IU]/mL (ref 0.450–4.500)

## 2019-09-19 LAB — HIV ANTIBODY (ROUTINE TESTING W REFLEX): HIV Screen 4th Generation wRfx: NONREACTIVE

## 2019-09-21 ENCOUNTER — Telehealth: Payer: Self-pay

## 2019-09-21 NOTE — Telephone Encounter (Signed)
Patient's mother calls nurse line regarding receiving results from appointment on 09/18/19. Mother reports that provider was waiting on lab work before beginning new medication. Will forward to PCP  Veronda Prude, RN

## 2019-09-25 NOTE — Telephone Encounter (Signed)
Mother calls and LVM on nurse line regarding receiving lab results from visit last week. I am happy to give her a detailed message regarding lab work.   To PCP. Please advise.   Veronda Prude, RN

## 2019-09-26 DIAGNOSIS — Z114 Encounter for screening for human immunodeficiency virus [HIV]: Secondary | ICD-10-CM | POA: Insufficient documentation

## 2019-09-26 DIAGNOSIS — R2241 Localized swelling, mass and lump, right lower limb: Secondary | ICD-10-CM | POA: Insufficient documentation

## 2019-09-26 DIAGNOSIS — Z1159 Encounter for screening for other viral diseases: Secondary | ICD-10-CM | POA: Insufficient documentation

## 2019-09-26 DIAGNOSIS — R739 Hyperglycemia, unspecified: Secondary | ICD-10-CM | POA: Insufficient documentation

## 2019-09-26 DIAGNOSIS — E039 Hypothyroidism, unspecified: Secondary | ICD-10-CM | POA: Insufficient documentation

## 2019-09-26 MED ORDER — FUROSEMIDE 20 MG PO TABS: 10.0000 mg | ORAL_TABLET | Freq: Every day | ORAL | 1 refills | Status: DC

## 2019-09-26 NOTE — Telephone Encounter (Signed)
Called mother and informed of below. Scheduled follow up 9/14 at 1330. Patient's mother has limited availability and requests appointment on Tuesday. Scheduled first available Tuesday appointment.   To PCP  Veronda Prude, RN

## 2019-09-26 NOTE — Assessment & Plan Note (Signed)
Patient without history of hypothyroidism, assessed TSH as a means of ruling out thyroid issue as a possible cause for his lower extremity swelling. -TSH collected, within normal limits

## 2019-09-26 NOTE — Progress Notes (Signed)
SUBJECTIVE:   CHIEF COMPLAINT / HPI:   Leg swelling: this is a very pleasant patient who presents to clinic today with his mother with concern for lower extremity swelling.  He reports that he has had right lower extremity swelling over the last couple of years.  He has previously been prescribed compression stockings but these are nearly impossible for him to put on by himself as he has CP.  He is starting to notice that the right leg is more swollen than the left and that when he stands to transfer he often feels an uncomfortable "throbbing" sensation in the right lower extremity.  Denies any trauma to the area, bleeding, discharge.  Also denies any shortness of breath.  He has previously been on Lasix for swelling before when he was working, but has not been on it since.  Healthcare maintenance: Patient is due for A1c, HIV, and hepatitis C screening.  Patient also due for Tdap, colonoscopy.  Would like to discuss with patient at follow-up visit.  PERTINENT  PMH / PSH:  Patient Active Problem List   Diagnosis Date Noted  . Localized swelling of right lower extremity 09/26/2019  . Encounter for screening for HIV 09/26/2019  . Encounter for hepatitis C screening test for low risk patient 09/26/2019  . Hypothyroidism 09/26/2019  . Hyperglycemia 09/26/2019  . Viral URI with cough 07/01/2017  . Chronic diarrhea 12/31/2015  . Elevated bilirubin 06/19/2015  . Elevated alkaline phosphatase level 06/10/2015  . Migraine 08/25/2013  . Seizures (HCC) 08/25/2013  . Herniated nucleus pulposus, C6-7 09/26/2012  . 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 Numbness and tingling of right leg 05/23/2012  . Mild intermittent chronic asthma without complication 12/10/2010  . Lower extremity edema 09/01/2010  . 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    OBJECTIVE:   BP 130/82   Pulse 92   SpO2 96%    Physical exam: General: Very pleasant patient, no apparent distress,  nontoxic-appearing Respiratory: CTA bilaterally, comfortable work of breathing, speaking complete sentences Cardio: RRR, S1-S2 present, no murmurs appreciated Abdomen: Soft, nontender, normal bowel sounds, no organomegaly Extremities: Right lower extremity with 2+ pitting edema, left lower extremity with trace edema; right lower extremity with what appears to be the beginning of venous stasis dermatitis without obvious sign of infection; no deformity or ecchymosis; right ankle and foot with full range of motion, painless, without deformity.      ASSESSMENT/PLAN:   Localized swelling of right lower extremity Patient with chronic history of lower extremity swelling, potentially due to muscle release surgery and wheelchair positioning.  Swelling goes down when he elevates his legs, swelling is better in the morning and worsens as the day goes on.  As this is a chronic issue there is low suspicion for DVT. Has difficulty applying compression stockings.  Concerned that continued swelling could lead to skin breakdown and create nidus for infection. -Patient's right lower extremity wrapped with Ace bandage by Dr. Leveda Anna and instructed to wear this during the day (this is easier to apply than the compression stocking. -CMP ordered to evaluate electrolytes, kidney, liver function so that we might be able to restart a diuretic - CMP with normal electrolytes, starting Lasix 10 mg daily -Also checking TSH as means of ruling out hypothyroidism as potential cause of swelling -Patient instructed to follow-up in 4 weeks  Encounter for screening for HIV -Low risk patient screen for HIV  Hyperglycemia -HbA1c ordered today as screening for diabetes, result was normal at  4.4%  Hypothyroidism Patient without history of hypothyroidism, assessed TSH as a means of ruling out thyroid issue as a possible cause for his lower extremity swelling. -TSH collected, within normal limits  Encounter for hepatitis C  screening test for low risk patient -Low risk patient screen for hepatitis C, hepatitis C negative     Dollene Cleveland, DO [facility]

## 2019-09-26 NOTE — Assessment & Plan Note (Signed)
-  Low risk patient screen for hepatitis C, hepatitis C negative

## 2019-09-26 NOTE — Assessment & Plan Note (Addendum)
Patient with chronic history of lower extremity swelling, potentially due to muscle release surgery and wheelchair positioning.  Swelling goes down when he elevates his legs, swelling is better in the morning and worsens as the day goes on.  As this is a chronic issue there is low suspicion for DVT. Has difficulty applying compression stockings.  Concerned that continued swelling could lead to skin breakdown and create nidus for infection. -Patient's right lower extremity wrapped with Ace bandage by Dr. Leveda Anna and instructed to wear this during the day (this is easier to apply than the compression stocking. -CMP ordered to evaluate electrolytes, kidney, liver function so that we might be able to restart a diuretic - CMP with normal electrolytes, starting Lasix 10 mg daily -Also checking TSH as means of ruling out hypothyroidism as potential cause of swelling -Patient instructed to follow-up in 4 weeks

## 2019-09-26 NOTE — Assessment & Plan Note (Signed)
-  HbA1c ordered today as screening for diabetes, result was normal at 4.4%

## 2019-09-26 NOTE — Assessment & Plan Note (Signed)
-  Low risk patient screen for HIV

## 2019-10-30 NOTE — Progress Notes (Signed)
° ° °  SUBJECTIVE:   CHIEF COMPLAINT / HPI:   Follow up Leg Swelling: Patient seen in clinic 09/18/2019 for chronic lower extremity swelling related to infantile Cerebral Palsy, being wheelchair bound, and previous muscle release surgery. Was started on Lasix 10mg  and provided with Ace bandage to wrap lower extremities to decrease swelling as compression stockings are very difficult to put on. Asked to follow up 4-6 weeks.   Patient reports that his swelling is better in the morning and he has been laying down at night sleeping horizontally.  He reports he takes a water pill on the days whenever he can, and is very difficult for him to be urinating all day when he takes Lasix.  He reports that he has been wearing the Ace bandage however he says it deforms his leg and sometimes hurts.  The patient has a history of breaking his right ankle.  He is concerned that the swelling is right lower leg is due to an old break in his ankle.  Health maintenance: Patient due for flu vaccine, and Tdap, and colonoscopy. Is receiving Flu shot today. Respectfully declined colonoscopy as due to the patient's past medical history would be very difficult for him to do the prep for colonoscopy.  PERTINENT  PMH / PSH:  Patient Active Problem List   Diagnosis Date Noted   Flu vaccine need 10/31/2019   Localized swelling of right lower extremity 09/26/2019   Encounter for screening for HIV 09/26/2019   Encounter for hepatitis C screening test for low risk patient 09/26/2019   Hypothyroidism 09/26/2019   Hyperglycemia 09/26/2019   Viral URI with cough 07/01/2017   Chronic diarrhea 12/31/2015   Elevated bilirubin 06/19/2015   Elevated alkaline phosphatase level 06/10/2015   Migraine 08/25/2013   Seizures (HCC) 08/25/2013   Herniated nucleus pulposus, C6-7 09/26/2012   Cervical spondylosis with myelopathy 06/20/2012   Numbness and tingling of right leg 05/23/2012   Mild intermittent chronic asthma  without complication 12/10/2010   Lower extremity edema 09/01/2010   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     OBJECTIVE:   BP 132/80    Pulse 80    Physical exam: General: Well-appearing, pleasant patient Respiratory: Comfortable work of breathing, speaking complete sentences Extremities: See images below     Lower Extremity Swelling 09/18/2019    Right LE Swelling 10/31/2019   ASSESSMENT/PLAN:   Lower extremity edema Patient continues to have right lower extremity edema.  Denies any significant change with fluid pill.  Reports that wrapping his leg with an Ace bandage to decrease the swelling makes it "deformed" and is sometimes painful. -X-ray of right foot to rule out bony cause of patient's LE swelling -Encouraged continued use of Ace bandage and Lasix when able  Flu vaccine need Patient received flu vaccine today     11/02/2019, DO [facility]

## 2019-10-31 ENCOUNTER — Other Ambulatory Visit: Payer: Self-pay

## 2019-10-31 ENCOUNTER — Ambulatory Visit (INDEPENDENT_AMBULATORY_CARE_PROVIDER_SITE_OTHER): Payer: Medicare Other | Admitting: Family Medicine

## 2019-10-31 ENCOUNTER — Encounter: Payer: Self-pay | Admitting: Family Medicine

## 2019-10-31 VITALS — BP 132/80 | HR 80

## 2019-10-31 DIAGNOSIS — Z23 Encounter for immunization: Secondary | ICD-10-CM | POA: Diagnosis not present

## 2019-10-31 DIAGNOSIS — R600 Localized edema: Secondary | ICD-10-CM | POA: Diagnosis not present

## 2019-10-31 MED ORDER — FUROSEMIDE 20 MG PO TABS
10.0000 mg | ORAL_TABLET | Freq: Every day | ORAL | 1 refills | Status: DC
Start: 2019-10-31 — End: 2020-03-07

## 2019-10-31 MED ORDER — ALBUTEROL SULFATE 1.25 MG/3ML IN NEBU: 1.0000 | INHALATION_SOLUTION | Freq: Four times a day (QID) | RESPIRATORY_TRACT | 3 refills | Status: DC | PRN

## 2019-10-31 NOTE — Assessment & Plan Note (Signed)
Patient continues to have right lower extremity edema.  Denies any significant change with fluid pill.  Reports that wrapping his leg with an Ace bandage to decrease the swelling makes it "deformed" and is sometimes painful. -X-ray of right foot to rule out bony cause of patient's LE swelling -Encouraged continued use of Ace bandage and Lasix when able

## 2019-10-31 NOTE — Patient Instructions (Addendum)
Thank you for coming in to see [place] today! Please see below to review our plan for today's visit:  1. Continue to wear your ACE wrap daily and elevate your legs to prevent a wound from forming on you shin.  2. Take the water pill daily as often as you can.  3. Go get your Xray!  Please call the clinic at [phone] if your symptoms worsen or you have any concerns. It was our pleasure to serve you!   Dr. Peggyann Shoals [facility]

## 2019-10-31 NOTE — Assessment & Plan Note (Signed)
Patient received flu vaccine today. 

## 2019-11-01 ENCOUNTER — Telehealth: Payer: Self-pay

## 2019-11-01 NOTE — Telephone Encounter (Signed)
Received phone call from [facility] pharmacy regarding rx for Furosemide. Pharmacist is requesting 90 day supply of furosemide. Written order is only for two months. Can quantity be changed to 45 for a three month supply?   To PCP  Please advise  Reference number for encounter [id number]  Veronda Prude, RN

## 2019-11-02 NOTE — Telephone Encounter (Signed)
Pharmacy calling to check status of below request.   Spoke with Dr. Dareen Piano, will give verbal okay to change dispense quantity to 45 tablets.   Called pharmacy, spoke with Kathlene November and gave verbal okay to change quantity to 45 tablets.   Reference number [id number]  Veronda Prude, RN

## 2019-11-02 NOTE — Telephone Encounter (Signed)
Pharmacy returns call to nurse line to verify if quantity can be changed to 90 day supply.   To PCP  Veronda Prude, RN

## 2019-11-06 ENCOUNTER — Telehealth: Payer: Self-pay

## 2019-11-06 NOTE — Telephone Encounter (Signed)
Received fax from pharmacy, PA needed on Nebulizer Solution. Clinical questions submitted via Cover My Meds. Waiting on response, could take up to 72 hours.  Cover My Meds info: Key: [id number]

## 2019-11-07 NOTE — Telephone Encounter (Signed)
Medication was denied. See below for additional questions. I can copy and paste your specific clinical rationale to covermymeds.

## 2019-11-10 ENCOUNTER — Ambulatory Visit
Admission: RE | Admit: 2019-11-10 | Discharge: 2019-11-10 | Disposition: A | Discharge status: Home / Self Care | Payer: Medicare Other | Source: Ambulatory Visit | Attending: Family Medicine | Admitting: Family Medicine

## 2019-11-10 DIAGNOSIS — R600 Localized edema: Secondary | ICD-10-CM

## 2019-11-10 NOTE — Telephone Encounter (Signed)
Received phone call from [facility] regarding albuterol nebulizer solution. Pharmacist is requesting rx be sent in with diagnosis code so that he can bill to a different insurance. Attempted to provide over the phone, however, pharmacist states that the code has to come from the original source, therefore, new rx is needed.   Please send in new rx with linked diagnosis code.   To PCP  Veronda Prude, RN

## 2019-11-14 NOTE — Telephone Encounter (Signed)
Patient's mother LVM on nurse line checking on status of albuterol nebulizer solution.   Mother is also requesting phone call from provider to discuss results of recent foot X-ray  To PCP  Veronda Prude, RN

## 2019-11-16 ENCOUNTER — Other Ambulatory Visit: Payer: Self-pay | Admitting: Family Medicine

## 2019-11-16 DIAGNOSIS — J4520 Mild intermittent asthma, uncomplicated: Secondary | ICD-10-CM

## 2019-11-16 MED ORDER — ALBUTEROL SULFATE 1.25 MG/3ML IN NEBU: 1.0000 | INHALATION_SOLUTION | Freq: Four times a day (QID) | RESPIRATORY_TRACT | 3 refills | Status: DC | PRN

## 2019-11-16 NOTE — Progress Notes (Signed)
Patient with history of mild intermittent chronic asthma for which she uses both an albuterol inhaler and albuterol nebulizer.  Due to patient's history of cerebral palsy coordination with the inhaler can oftentimes be difficult and patient benefits from albuterol nebulizer with significant shortness of breath.  New prescription filled out and sent to [facility] pharmacy.  Medication needs prior authorization: Standard prior authorization can be done as patient does have a backup albuterol inhaler.  Peggyann Shoals, DO [facility], PGY-3 11/16/2019 7:18 PM

## 2020-02-22 ENCOUNTER — Other Ambulatory Visit: Payer: Self-pay

## 2020-02-22 DIAGNOSIS — J4520 Mild intermittent asthma, uncomplicated: Secondary | ICD-10-CM

## 2020-02-23 MED ORDER — ALBUTEROL SULFATE 1.25 MG/3ML IN NEBU: 1.0000 | INHALATION_SOLUTION | Freq: Four times a day (QID) | RESPIRATORY_TRACT | 3 refills | Status: AC | PRN

## 2020-03-07 ENCOUNTER — Other Ambulatory Visit: Payer: Self-pay

## 2020-03-07 MED ORDER — FUROSEMIDE 20 MG PO TABS
10.0000 mg | ORAL_TABLET | Freq: Every day | ORAL | 1 refills | Status: DC
Start: 2020-03-07 — End: 2020-03-11

## 2020-03-07 MED ORDER — LOPERAMIDE HCL 2 MG PO CAPS
2.0000 mg | ORAL_CAPSULE | Freq: Every day | ORAL | 2 refills | Status: DC
Start: 2020-03-07 — End: 2020-11-11

## 2020-03-11 MED ORDER — FUROSEMIDE 20 MG PO TABS: 10.0000 mg | ORAL_TABLET | Freq: Every day | ORAL | 0 refills | Status: DC

## 2020-03-11 NOTE — Telephone Encounter (Signed)
Pharmacy calls nurse line requesting #45 tabs for lasix. Pharmacy reports this is more cost effective for the patient. They have not dispensed the original #30 called in on 01/20 yet. Please advise.

## 2020-03-11 NOTE — Addendum Note (Signed)
Addended by: Steva Colder on: 03/11/2020 12:04 PM   Modules accepted: Orders

## 2020-04-11 ENCOUNTER — Other Ambulatory Visit: Payer: Self-pay

## 2020-04-11 DIAGNOSIS — J4520 Mild intermittent asthma, uncomplicated: Secondary | ICD-10-CM

## 2020-04-11 MED ORDER — FLOVENT HFA 110 MCG/ACT IN AERO
2.0000 | INHALATION_SPRAY | Freq: Two times a day (BID) | RESPIRATORY_TRACT | 3 refills | Status: DC
Start: 2020-04-11 — End: 2021-12-30

## 2020-04-11 MED ORDER — ALBUTEROL SULFATE (2.5 MG/3ML) 0.083% IN NEBU: 2.5000 mg | INHALATION_SOLUTION | Freq: Four times a day (QID) | RESPIRATORY_TRACT | 3 refills | Status: DC | PRN

## 2020-04-12 NOTE — Telephone Encounter (Addendum)
Melissa from [employer] calls nurse line regarding clarification on albuterol rx. Reports that patient was requesting albuterol HFA 90 mcg inhaler, however Proventil solution was sent into pharmacy. Please advise if new rx for albuterol inhaler can be sent into the pharmacy.    Veronda Prude, RN

## 2020-04-16 MED ORDER — ALBUTEROL SULFATE HFA 108 (90 BASE) MCG/ACT IN AERS
1.0000 | INHALATION_SPRAY | Freq: Four times a day (QID) | RESPIRATORY_TRACT | 6 refills | Status: DC | PRN
Start: 2020-04-16 — End: 2021-01-06

## 2020-04-16 NOTE — Addendum Note (Signed)
Addended by: Dollene Cleveland on: 04/16/2020 06:54 AM   Modules accepted: Orders

## 2020-05-14 ENCOUNTER — Other Ambulatory Visit: Payer: Self-pay

## 2020-05-14 MED ORDER — FUROSEMIDE 20 MG PO TABS: 10.0000 mg | ORAL_TABLET | Freq: Every day | ORAL | 3 refills | Status: DC

## 2020-07-02 ENCOUNTER — Telehealth: Payer: Self-pay

## 2020-07-02 NOTE — Telephone Encounter (Signed)
Patient's mother calls nurse line requesting order for Cologuard testing.   Please advise if order can be placed.   Veronda Prude, RN

## 2020-07-08 NOTE — Telephone Encounter (Signed)
Mother returns call to nurse line to check status of receiving cologuard testing.   Veronda Prude, RN

## 2020-07-10 ENCOUNTER — Other Ambulatory Visit: Payer: Self-pay | Admitting: Family Medicine

## 2020-07-10 DIAGNOSIS — Z1211 Encounter for screening for malignant neoplasm of colon: Secondary | ICD-10-CM | POA: Insufficient documentation

## 2020-07-10 NOTE — Progress Notes (Signed)
Order placed for Cologuard test.  Message sent to lab informing them that order has been placed for this patient.   Peggyann Shoals, DO [facility], PGY-3 07/10/2020 7:31 PM

## 2020-07-11 NOTE — Telephone Encounter (Signed)
Called mother and informed of below.   Veronda Prude, RN

## 2020-07-15 ENCOUNTER — Telehealth: Payer: Self-pay | Admitting: Family Medicine

## 2020-07-15 ENCOUNTER — Ambulatory Visit (HOSPITAL_COMMUNITY): Admission: EM | Admit: 2020-07-15 | Discharge: 2020-07-15 | Payer: Medicare Other

## 2020-07-15 ENCOUNTER — Other Ambulatory Visit: Payer: Self-pay

## 2020-07-15 ENCOUNTER — Emergency Department (HOSPITAL_COMMUNITY): Payer: Medicare Other

## 2020-07-15 ENCOUNTER — Encounter (HOSPITAL_COMMUNITY): Payer: Self-pay | Admitting: Emergency Medicine

## 2020-07-15 ENCOUNTER — Emergency Department (HOSPITAL_COMMUNITY)
Admission: EM | Admit: 2020-07-15 | Discharge: 2020-07-15 | Disposition: A | Discharge status: Home / Self Care | Payer: Medicare Other | Attending: Emergency Medicine | Admitting: Emergency Medicine

## 2020-07-15 DIAGNOSIS — R5381 Other malaise: Secondary | ICD-10-CM | POA: Diagnosis not present

## 2020-07-15 DIAGNOSIS — R059 Cough, unspecified: Secondary | ICD-10-CM | POA: Diagnosis present

## 2020-07-15 DIAGNOSIS — E039 Hypothyroidism, unspecified: Secondary | ICD-10-CM | POA: Diagnosis not present

## 2020-07-15 DIAGNOSIS — Z751 Person awaiting admission to adequate facility elsewhere: Secondary | ICD-10-CM

## 2020-07-15 DIAGNOSIS — Z79899 Other long term (current) drug therapy: Secondary | ICD-10-CM | POA: Insufficient documentation

## 2020-07-15 DIAGNOSIS — Z7951 Long term (current) use of inhaled steroids: Secondary | ICD-10-CM | POA: Insufficient documentation

## 2020-07-15 DIAGNOSIS — J4520 Mild intermittent asthma, uncomplicated: Secondary | ICD-10-CM | POA: Diagnosis not present

## 2020-07-15 DIAGNOSIS — U071 COVID-19: Secondary | ICD-10-CM | POA: Insufficient documentation

## 2020-07-15 LAB — CBC WITH DIFFERENTIAL/PLATELET
Abs Immature Granulocytes: 0.02 10*3/uL (ref 0.00–0.07)
Basophils Absolute: 0 10*3/uL (ref 0.0–0.1)
Basophils Relative: 1 %
Eosinophils Absolute: 0 10*3/uL (ref 0.0–0.5)
Eosinophils Relative: 0 %
HCT: 43.1 % (ref 39.0–52.0)
Hemoglobin: 14.6 g/dL (ref 13.0–17.0)
Immature Granulocytes: 0 %
Lymphocytes Relative: 7 %
Lymphs Abs: 0.4 10*3/uL — ABNORMAL LOW (ref 0.7–4.0)
MCH: 29.9 pg (ref 26.0–34.0)
MCHC: 33.9 g/dL (ref 30.0–36.0)
MCV: 88.3 fL (ref 80.0–100.0)
Monocytes Absolute: 0.5 10*3/uL (ref 0.1–1.0)
Monocytes Relative: 9 %
Neutro Abs: 4.8 10*3/uL (ref 1.7–7.7)
Neutrophils Relative %: 83 %
Platelets: 206 10*3/uL (ref 150–400)
RBC: 4.88 MIL/uL (ref 4.22–5.81)
RDW: 12.5 % (ref 11.5–15.5)
WBC: 5.9 10*3/uL (ref 4.0–10.5)
nRBC: 0 % (ref 0.0–0.2)

## 2020-07-15 LAB — RESP PANEL BY RT-PCR (FLU A&B, COVID) ARPGX2
Influenza A by PCR: NEGATIVE
Influenza B by PCR: NEGATIVE
SARS Coronavirus 2 by RT PCR: POSITIVE — AB

## 2020-07-15 LAB — BASIC METABOLIC PANEL
Anion gap: 7 (ref 5–15)
BUN: 10 mg/dL (ref 6–20)
CO2: 28 mmol/L (ref 22–32)
Calcium: 9.2 mg/dL (ref 8.9–10.3)
Chloride: 101 mmol/L (ref 98–111)
Creatinine, Ser: 0.76 mg/dL (ref 0.61–1.24)
GFR, Estimated: >60 mL/min (ref >60)
Glucose, Bld: 115 mg/dL — ABNORMAL HIGH (ref 70–99)
Potassium: 3.9 mmol/L (ref 3.5–5.1)
Sodium: 136 mmol/L (ref 135–145)

## 2020-07-15 MED ORDER — NIRMATRELVIR/RITONAVIR (PAXLOVID)TABLET: 3.0000 | ORAL_TABLET | Freq: Two times a day (BID) | ORAL | 0 refills | Status: AC

## 2020-07-15 NOTE — Discharge Instructions (Addendum)
Call your primary care doctor in the next 1-2 days to arrange video follow-up.  Use a finger pulse oximeter at home.  You may purchase one at [facility] or [facility] or online.  If the numbers drops and stays below 90%, return immediately back to the ER.  Otherwise increase your fluid intake, isolate at home for 5 days after symptoms resolve, and inform recent close contacts of the need to test for Covid.  

## 2020-07-15 NOTE — ED Notes (Signed)
Patient discharged from ED. Verbalizes understanding.

## 2020-07-15 NOTE — ED Notes (Signed)
ED Provider at bedside. 

## 2020-07-15 NOTE — ED Triage Notes (Signed)
+   home COVID test this morning.  Reports generalized weakness and cough since last night.  Denies SOB.  Denies nausea and vomiting.

## 2020-07-15 NOTE — ED Provider Notes (Signed)
Emergency Medicine Provider Triage Evaluation Note  [name] , a 55 y.o. male  was evaluated in triage.  Pt complains of SOB.  Review of Systems  Positive: Covid+, cough, sob, fatigue Negative: Fever, loss of taste or smell, voming or diarrhea  Physical Exam  There were no vitals taken for this visit. Gen:   Awake, no distress   Resp:  Normal effort  MSK:   Moves extremities without difficulty  Other:    Medical Decision Making  Medically screening exam initiated at 10:02 AM.  Appropriate orders placed.  JALENE LACKO was informed that the remainder of the evaluation will be completed by another provider, this initial triage assessment does not replace that evaluation, and the importance of remaining in the ED until their evaluation is complete.  Hx of CP, has diminished lung capacity, developed covid sxs since yesterday, positive home covid test today.  Family request treatment for covid due to his comorbidity as well as home o2 in the 43s.  It's 94% on RA today. Fully vaccinated.    Fayrene Helper, PA-C 07/15/20 1009    Cheryll Cockayne, MD 07/15/20 [phone]

## 2020-07-15 NOTE — ED Provider Notes (Signed)
[facility] EMERGENCY DEPARTMENT Provider Note   CSN: [id number] Arrival date & time: 07/15/20  [id number]     History No chief complaint on file.   [name] is a 55 y.o. male.  Patient presents chief complaint of cough generalized malaise.  He states he lives with his significant other who contracted COVID 2 days ago.  Patient symptoms began yesterday.  He is concerned he may have COVID as well, tested himself at home and was positive with a home COVID test and presents to the ER.  Denies any pain at this time.  Denies vomiting or diarrhea.  Denies fevers at home.        Past Medical History:  Diagnosis Date  . Asthma    albuterol has to rarely usely  . Bronchitis    hx of  . Cerebral palsy (HCC)   . 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 Collapse, lung 2005   left  . Gait disorder   . Migraine   . Migraine   . Obesity   . Peripheral edema   . Pneumonia    hx of  . Vitamin D deficiency     Patient Active Problem List   Diagnosis Date Noted  . Screening for colon cancer 07/10/2020  . Flu vaccine need 10/31/2019  . Localized swelling of right lower extremity 09/26/2019  . Encounter for screening for HIV 09/26/2019  . Encounter for hepatitis C screening test for low risk patient 09/26/2019  . Hypothyroidism 09/26/2019  . Hyperglycemia 09/26/2019  . Viral URI with cough 07/01/2017  . Chronic diarrhea 12/31/2015  . Elevated bilirubin 06/19/2015  . Elevated alkaline phosphatase level 06/10/2015  . Migraine 08/25/2013  . Seizures (HCC) 08/25/2013  . Herniated nucleus pulposus, C6-7 09/26/2012  . 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 Numbness and tingling of right leg 05/23/2012  . Mild intermittent chronic asthma without complication 12/10/2010  . Lower extremity edema 09/01/2010  . 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 ANTERIOR CERVICAL DECOMP/DISCECTOMY  FUSION N/A 09/26/2012   Procedure: Cervical six-seven Anterior cervical decompression/diskectomy/fusion;  Surgeon: Barnett Abu, MD;  Location: [facility];  Service: Neurosurgery;  Laterality: N/A;  . CERVICAL FUSION    . CHOLECYSTECTOMY    . INTERTROCHANTERIC HIP FRACTURE SURGERY Right   . LAMINECTOMY LUMBAR SPLINE W/ PLACEMENT SPINAL CORD STIMULATOR    . LUNG SURGERY  2004  . TENDON TRANSFER     Multiple, both legs       Family History  Problem Relation Age of Onset  . Cancer - Lung Maternal Uncle   . Diabetes Maternal Grandmother   . Cancer Maternal Grandfather        Stomach    Social History   Tobacco Use  . Smoking status: Never Smoker  . Smokeless tobacco: Never Used  Substance Use Topics  . Alcohol use: No    Home Medications Prior to Admission medications   Medication Sig Start Date End Date Taking? Authorizing Provider  nirmatrelvir/ritonavir EUA (PAXLOVID) TABS Take 3 tablets by mouth 2 (two) times daily for 5 days. Patient GFR is >60. Take nirmatrelvir (150 mg) two tablets twice daily for 5 days and ritonavir (100 mg) one tablet twice daily for 5 days. 07/15/20 07/20/20 Yes Cheryll Cockayne, MD  acetaminophen (TYLENOL ARTHRITIS PAIN) 650 MG CR tablet Take 1,300 mg by mouth daily as needed.     [provider]  albuterol (  ACCUNEB) 1.25 MG/3ML nebulizer solution Take 3 mLs (1.25 mg total) by nebulization every 6 (six) hours as needed for wheezing. 02/23/20   Dollene Cleveland, DO  albuterol (VENTOLIN HFA) 108 (90 Base) MCG/ACT inhaler Inhale 1-2 puffs into the lungs every 6 (six) hours as needed for wheezing or shortness of breath. 04/16/20   Dollene Cleveland, DO  fluticasone (FLONASE) 50 MCG/ACT nasal spray Place 2 sprays into both nostrils daily. 06/28/17   Diallo, Lilia Argue, MD  fluticasone (FLOVENT HFA) 110 MCG/ACT inhaler Inhale 2 puffs into the lungs 2 (two) times daily. 04/11/20   Dollene Cleveland, DO  furosemide (LASIX) 20 MG tablet Take 0.5 tablets (10  mg total) by mouth daily. 05/14/20   Dollene Cleveland, DO  levETIRAcetam (KEPPRA) 500 MG tablet Take 1 tablet by mouth every other day 06/23/19   Dollene Cleveland, DO  loperamide (IMODIUM) 2 MG capsule Take 1 capsule (2 mg total) by mouth daily. 03/07/20   Dollene Cleveland, DO    Allergies    Patient has no known allergies.  Review of Systems   Review of Systems  Constitutional: Negative for fever.  HENT: Negative for ear pain and sore throat.   Eyes: Negative for pain.  Respiratory: Positive for cough.   Cardiovascular: Negative for chest pain.  Gastrointestinal: Negative for abdominal pain.  Genitourinary: Negative for flank pain.  Musculoskeletal: Negative for back pain.  Skin: Negative for color change and rash.  Neurological: Negative for syncope.  All other systems reviewed and are negative.   Physical Exam Updated Vital Signs BP (!) 138/99 (BP Location: Right Arm)   Pulse (!) 110   Temp 98.8 F (37.1 C) (Oral)   Resp 20   SpO2 93%   Physical Exam Constitutional:      General: He is not in acute distress.    Appearance: He is well-developed.  HENT:     Head: Normocephalic.     Nose: Nose normal.  Eyes:     Extraocular Movements: Extraocular movements intact.  Cardiovascular:     Rate and Rhythm: Normal rate.  Pulmonary:     Effort: Pulmonary effort is normal.  Skin:    Coloration: Skin is not jaundiced.  Neurological:     Mental Status: He is alert. Mental status is at baseline.     ED Results / Procedures / Treatments   Labs (all labs ordered are listed, but only abnormal results are displayed) Labs Reviewed  BASIC METABOLIC PANEL - Abnormal; Notable for the following components:      Result Value   Glucose, Bld 115 (*)    All other components within normal limits  CBC WITH DIFFERENTIAL/PLATELET - Abnormal; Notable for the following components:   Lymphs Abs 0.4 (*)    All other components within normal limits  RESP PANEL BY RT-PCR (FLU A&B,  COVID) [id number]    EKG EKG Interpretation  Date/Time:  Monday Jul 15 2020 10:11:02 EDT Ventricular Rate:  103 PR Interval:  168 QRS Duration: 88 QT Interval:  328 QTC Calculation: 429 R Axis:   82 Text Interpretation: Sinus tachycardia Otherwise normal ECG Confirmed by Norman Clay ([id number]) on 07/15/2020 10:23:24 AM   Radiology DG Chest Port 1 View  Result Date: 07/15/2020 CLINICAL DATA:  55 year old male with history of shortness of breath. History of COVID infection. EXAM: PORTABLE CHEST 1 VIEW COMPARISON:  Chest x-ray 05/24/2014. FINDINGS: Elevation of the left hemidiaphragm is chronic and similar to the prior study. No definite acute  consolidative airspace disease. No pleural effusions. No pneumothorax. No evidence of pulmonary edema. Heart size is normal. Mediastinal contours are distorted by patient positioning and rightward shift of cardiac structures secondary to the chronically elevated left hemidiaphragm. Orthopedic fixation hardware in the cervical spine incidentally noted. IMPRESSION: 1. No radiographic evidence of acute cardiopulmonary disease. 2. 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 Electronically Signed   By: Trudie Reed M.D.   On: 07/15/2020 10:41    Procedures Procedures   Medications Ordered in ED Medications - No data to display  ED Course  I have reviewed the triage vital signs and the nursing notes.  Pertinent labs & imaging results that were available during my care of the patient were reviewed by me and considered in my medical decision making (see chart for details).    MDM Rules/Calculators/A&P                          Initial vital signs are within normal limits O2 saturation around 93 to 95% on room air which is appropriate.  Labs are sent and these are unremarkable white count normal chemistry normal.  Patient significant other at home is positive for COVID.  Patient himself tested positive for COVID at home as well today.  Patient  candidate for oral COVID medication.  He states he has already received vaccinations in the past.  Recommending fluids and isolation at home, recommending immediate return for difficulty breathing or any additional concerns.  Final Clinical Impression(s) / ED Diagnoses Final diagnoses:  COVID-19 virus infection    Rx / DC Orders ED Discharge Orders         Ordered    nirmatrelvir/ritonavir EUA (PAXLOVID) TABS  2 times daily        07/15/20 1121           Cheryll Cockayne, MD 07/15/20 1122

## 2020-07-15 NOTE — ED Notes (Signed)
Patient is being discharged from the Urgent Care and sent to the Emergency Department via wheelchair by caregivers . Per provider Pia Mau, patient is in need of higher level of care due to decrease in oxygen and SOB. Patient is aware and verbalizes understanding of plan of care. There were no vitals filed for this visit.

## 2020-07-15 NOTE — Telephone Encounter (Signed)
After-hours call line Patient's mother called the after-hours call line because the patient tested positive for COVID this morning.  She reports that his wife tested positive a few days ago but that he started having symptoms last night and when they checked this morning he was positive as well.  She has an at home pulse oximeter which she reports he is registering 82 to 83%.  She was calling because she was interested in getting Paxil of it for him.  She reports that he does not have increased work of breathing.  Given his low O2 sats I recommended they can be evaluated in the emergency department.  He may not need admission but if he does we would be happy to admit.

## 2020-07-16 ENCOUNTER — Telehealth (HOSPITAL_COMMUNITY): Payer: Self-pay

## 2020-07-24 ENCOUNTER — Other Ambulatory Visit: Payer: Self-pay

## 2020-07-24 DIAGNOSIS — G809 Cerebral palsy, unspecified: Secondary | ICD-10-CM

## 2020-07-24 MED ORDER — LEVETIRACETAM 500 MG PO TABS: ORAL_TABLET | ORAL | 3 refills | Status: DC

## 2020-08-06 LAB — COLOGUARD
COLOGUARD: NEGATIVE
Cologuard: NEGATIVE

## 2020-08-28 ENCOUNTER — Telehealth: Payer: Self-pay

## 2020-08-28 NOTE — Telephone Encounter (Signed)
Patients mother calls nurse line requesting the status of patients DME equipment. Mother reports he needs new tires and batteries. The form is in PCP box for completion.

## 2020-11-11 ENCOUNTER — Other Ambulatory Visit: Payer: Self-pay

## 2020-11-11 MED ORDER — LOPERAMIDE HCL 2 MG PO CAPS: 2.0000 mg | ORAL_CAPSULE | Freq: Every day | ORAL | 2 refills | Status: DC

## 2021-01-06 ENCOUNTER — Other Ambulatory Visit: Payer: Self-pay

## 2021-01-06 MED ORDER — ALBUTEROL SULFATE (2.5 MG/3ML) 0.083% IN NEBU: 2.5000 mg | INHALATION_SOLUTION | Freq: Four times a day (QID) | RESPIRATORY_TRACT | 0 refills | Status: DC | PRN

## 2021-08-22 ENCOUNTER — Ambulatory Visit: Payer: Medicare Other | Admitting: Family Medicine

## 2021-12-30 ENCOUNTER — Encounter: Payer: Self-pay | Admitting: Family Medicine

## 2021-12-30 ENCOUNTER — Ambulatory Visit (INDEPENDENT_AMBULATORY_CARE_PROVIDER_SITE_OTHER): Payer: Medicare Other | Admitting: Family Medicine

## 2021-12-30 VITALS — BP 137/95 | HR 84

## 2021-12-30 DIAGNOSIS — Z1211 Encounter for screening for malignant neoplasm of colon: Secondary | ICD-10-CM | POA: Diagnosis not present

## 2021-12-30 DIAGNOSIS — J4520 Mild intermittent asthma, uncomplicated: Secondary | ICD-10-CM | POA: Diagnosis not present

## 2021-12-30 DIAGNOSIS — Z0000 Encounter for general adult medical examination without abnormal findings: Secondary | ICD-10-CM

## 2021-12-30 MED ORDER — FLUTICASONE PROPIONATE HFA 110 MCG/ACT IN AERO: 2.0000 | INHALATION_SPRAY | Freq: Two times a day (BID) | RESPIRATORY_TRACT | 3 refills | Status: DC

## 2021-12-30 MED ORDER — FLUTICASONE PROPIONATE 50 MCG/ACT NA SUSP: 2.0000 | Freq: Every day | NASAL | 6 refills | Status: DC

## 2021-12-30 MED ORDER — ALBUTEROL SULFATE (2.5 MG/3ML) 0.083% IN NEBU: 2.5000 mg | INHALATION_SOLUTION | Freq: Four times a day (QID) | RESPIRATORY_TRACT | 0 refills | Status: DC | PRN

## 2021-12-30 NOTE — Patient Instructions (Addendum)
It was great seeing you today!  I am glad that you are doing well! I have ordered another cologuard.   Please follow up at your next scheduled appointment, if anything arises between now and then, please don't hesitate to contact our office.   Thank you for allowing [place] to be a part of your medical care!  Thank you, Dr. Robyne Peers  Also a reminder of our clinic's no-show policy. Please make sure to arrive at least 15 minutes prior to your scheduled appointment time. Please try to cancel before 24 hours if you are not able to make it. If you no-show for 2 appointments then you will be receiving a warning letter. If you no-show after 3 visits, then you may be at risk of being dismissed from our clinic. This is to ensure that everyone is able to be seen in a timely manner. Thank you, we appreciate your assistance with this!

## 2021-12-30 NOTE — Progress Notes (Signed)
    SUBJECTIVE:   CHIEF COMPLAINT / HPI:   Patient presents for follow up, denies any concerns. Mother also present during visit.  Staring spells when he stops doing what he is doing and takes Keppra every other day for this, taking it at least the last 4 years. Not following up with a neurologist currently, this is prescribed by a psychiatrist. Denies any concerns today.   OBJECTIVE:   BP (!) 137/95   Pulse 84   SpO2 95%   General: Patient in wheelchair, in no acute distress. HEENT: non-tender thyroid CV: RRR, no murmurs or gallops auscultated Resp: CTAB, no wheezing, rales or rhonchi noted Abdomen: soft, nontender, nondistended, presence of bowel sounds Ext: no LE edema noted bilaterally Psych: mood appropriate, very pleasant   ASSESSMENT/PLAN:   Health maintenance -discussed importance of colon cancer screening with colonoscopy, this is less feasible and not ideal given patient's wheelchair bound state. He has had the cologuard last year and is not due until 2 years from now. Patient notified of this.  -Med rec reviewed and updated appropriately. Refills provided as appropriate.  -Follow up with psychiatry as appropriate for Keppra refills, seizures stable  -PHQ-9 score of 0 reviewed.    Reece Leader, DO [facility]

## 2022-02-19 ENCOUNTER — Other Ambulatory Visit: Payer: Self-pay

## 2022-02-19 DIAGNOSIS — G809 Cerebral palsy, unspecified: Secondary | ICD-10-CM

## 2022-02-19 DIAGNOSIS — J4520 Mild intermittent asthma, uncomplicated: Secondary | ICD-10-CM

## 2022-02-19 MED ORDER — FLUTICASONE PROPIONATE HFA 110 MCG/ACT IN AERO: 2.0000 | INHALATION_SPRAY | Freq: Two times a day (BID) | RESPIRATORY_TRACT | 3 refills | Status: DC

## 2022-02-19 MED ORDER — LOPERAMIDE HCL 2 MG PO CAPS: 2.0000 mg | ORAL_CAPSULE | Freq: Every day | ORAL | 2 refills | Status: DC

## 2022-02-19 MED ORDER — FLUTICASONE PROPIONATE 50 MCG/ACT NA SUSP: 2.0000 | Freq: Every day | NASAL | 6 refills | Status: DC

## 2022-02-20 ENCOUNTER — Telehealth: Payer: Self-pay

## 2022-02-20 DIAGNOSIS — G809 Cerebral palsy, unspecified: Secondary | ICD-10-CM

## 2022-02-20 NOTE — Telephone Encounter (Signed)
Patient and mother call nurse line in regards to Cologuard.   Mother is requesting a Cologuard order. No concerns.   Patient had a negative Cologuard last year.   Will forward to PCP.

## 2022-02-23 NOTE — Telephone Encounter (Signed)
Mother contacted and advised of PCP recommendation.   She reports Keppra was not called in last week with other medications.   Covering provider reported we do not fill this medication for him. Mother reports, however we do. Last fill was by H. Anderson in 2022. Mother reports he does not take this medication everyday. Only when he "needs" it. He does not see a specialist per mother.   Will forward to PCP for refill clarification.

## 2022-02-26 NOTE — Telephone Encounter (Signed)
Spoke with mother.   She reports he has not seen PS[facility] since "covid I think." She reports she will reach out to  them for a refill.   Patient advised to call us back if they are unable to provide a refill for medication.

## 2022-03-19 ENCOUNTER — Other Ambulatory Visit: Payer: Self-pay | Admitting: Family Medicine

## 2022-03-19 DIAGNOSIS — J4520 Mild intermittent asthma, uncomplicated: Secondary | ICD-10-CM

## 2022-04-28 ENCOUNTER — Telehealth: Payer: Self-pay | Admitting: Family Medicine

## 2022-04-28 NOTE — Telephone Encounter (Signed)
Called patient to schedule Medicare Annual Wellness Visit (AWV). Left message for patient to call back and schedule Medicare Annual Wellness Visit (AWV).  Last date of AWV: 02/17/2011  Please schedule an AWVI appointment at any time with [facility].  If any questions, please contact me at [phone].    Thank you,  [place] Direct dial  [phone]

## 2022-06-30 ENCOUNTER — Ambulatory Visit: Payer: Medicare Other | Admitting: Family Medicine

## 2022-07-10 ENCOUNTER — Encounter: Payer: Self-pay | Admitting: Family Medicine

## 2022-07-10 ENCOUNTER — Ambulatory Visit (INDEPENDENT_AMBULATORY_CARE_PROVIDER_SITE_OTHER): Payer: Medicare Other | Admitting: Family Medicine

## 2022-07-10 VITALS — BP 138/86 | HR 78 | Ht 69.0 in

## 2022-07-10 DIAGNOSIS — Z993 Dependence on wheelchair: Secondary | ICD-10-CM

## 2022-07-10 DIAGNOSIS — R569 Unspecified convulsions: Secondary | ICD-10-CM | POA: Diagnosis present

## 2022-07-10 DIAGNOSIS — Z1211 Encounter for screening for malignant neoplasm of colon: Secondary | ICD-10-CM

## 2022-07-10 NOTE — Patient Instructions (Addendum)
It was great seeing you today!  Today we discussed many things. I have completed your SCAT paperwork, please let me know if there are any issues with this.  We will get blood work today to check your electrolytes and kidney function, I will let you know of the results when they return.  I have also ordered the cologuard to screen for colon cancer.  Please continue to follow up with the neurologist and continue keppra.   Please follow up at your next scheduled appointment in 6 months, if anything arises between now and then, please don't hesitate to contact our office.   Thank you for allowing [place] to be a part of your medical care!  Thank you, Dr. Robyne Peers

## 2022-07-10 NOTE — Progress Notes (Signed)
    SUBJECTIVE:   CHIEF COMPLAINT / HPI:   Patient presents for follow up, he is needing paperwork to continue his transportation services. Needing transportation to help him get around as he is wheelchair bound. Doing well otherwise. Accompanied by his mother today. Denies any concerns today.   History of seizures, still on Keppra per neurology. Has not had a seizure in years, neurology still prescribing the Keppra and sees them as needed.   OBJECTIVE:   BP 138/86   Pulse 78   Ht 5\' 9"  (1.753 m)   SpO2 96%   BMI 31.01 kg/m   General: Patient well-appearing, in no acute distress. CV: RRR, no murmurs or gallops auscultated  Resp: CTAB, no wheezing, rales or rhonchi noted Ext: trace non-pitting edema noted bilaterally  Neuro: wheelchair bound Psych: mood and affect appropriate   ASSESSMENT/PLAN:   Wheelchair dependent -SCAT paperwork completed and return to patient  -recommended to elevate legs nightly at minimum to limit chronic LE edema from wheelchair bound state  Seizures (HCC) -chronic and stable -continue Keppra -pending BMP to monitor electrolytes and renal function  -follow up with neurology as appropriate   Health maintenance -Colon cancer screening discussed, difficult due to wheelchair bound state. Has done cologuard in the past, ordered for another cologuard test.  -Med rec reviewed and updated appropriately  -Follow up in 6 months for next annual wellness visit     [name] Robyne Peers, DO [facility]

## 2022-07-10 NOTE — Assessment & Plan Note (Signed)
-  chronic and stable -continue Keppra -pending BMP to monitor electrolytes and renal function  -follow up with neurology as appropriate

## 2022-07-10 NOTE — Assessment & Plan Note (Signed)
-  SCAT paperwork completed and return to patient  -recommended to elevate legs nightly at minimum to limit chronic LE edema from wheelchair bound state

## 2022-07-11 LAB — BASIC METABOLIC PANEL
BUN/Creatinine Ratio: 16 (ref 9–20)
BUN: 12 mg/dL (ref 6–24)
CO2: 28 mmol/L (ref 20–29)
Calcium: 10 mg/dL (ref 8.7–10.2)
Chloride: 100 mmol/L (ref 96–106)
Creatinine, Ser: 0.73 mg/dL — ABNORMAL LOW (ref 0.76–1.27)
Glucose: 86 mg/dL (ref 70–99)
Potassium: 4.2 mmol/L (ref 3.5–5.2)
Sodium: 143 mmol/L (ref 134–144)
eGFR: 108 mL/min/{1.73_m2} (ref >59)

## 2022-07-14 ENCOUNTER — Encounter: Payer: Self-pay | Admitting: Family Medicine

## 2022-07-31 ENCOUNTER — Other Ambulatory Visit: Payer: Self-pay | Admitting: Family Medicine

## 2022-08-31 ENCOUNTER — Encounter: Payer: Self-pay | Admitting: Family Medicine

## 2022-08-31 ENCOUNTER — Ambulatory Visit: Payer: Medicare Other | Admitting: Family Medicine

## 2022-08-31 VITALS — BP 141/98 | HR 88

## 2022-08-31 DIAGNOSIS — R600 Localized edema: Secondary | ICD-10-CM | POA: Diagnosis not present

## 2022-08-31 DIAGNOSIS — G809 Cerebral palsy, unspecified: Secondary | ICD-10-CM | POA: Diagnosis not present

## 2022-08-31 DIAGNOSIS — Z23 Encounter for immunization: Secondary | ICD-10-CM | POA: Diagnosis not present

## 2022-08-31 MED ORDER — CEPHALEXIN 500 MG PO CAPS: 500.0000 mg | ORAL_CAPSULE | Freq: Four times a day (QID) | ORAL | 0 refills | Status: DC

## 2022-08-31 MED ORDER — LEVETIRACETAM 500 MG PO TABS: ORAL_TABLET | ORAL | 0 refills | Status: DC

## 2022-08-31 MED ORDER — COLCHICINE 0.6 MG PO TABS: 0.6000 mg | ORAL_TABLET | Freq: Every day | ORAL | 0 refills | Status: DC

## 2022-08-31 NOTE — Progress Notes (Signed)
   SUBJECTIVE:   CHIEF COMPLAINT / HPI:  Chief Complaint  Patient presents with   foot swollen and painful   [name] is a 57 y.o. 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  Left foot edema Has chronic right leg edema. Left foot is newly edematous and has been progressively swelling since last Friday (1 week). Middle of foot is painful, feels better when stabilized in shoe. Pain is constant, but low grade.  Resolves when foot is supported.  Wears toe shoes all the time. Noticed a rash he believed to be athlete's foot a little while ago, but that improved with athlete's foot cream. Patient does not recall any recent injuries or falls. No systemic symptoms including fever, chills.  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 Cardiovascular: Regular rate and rhythm. Normal S1/S2. No murmurs, rubs, or gallops appreciated. 2+ radial pulses. MSK: Deconditioned with spontaneous movement of extremities intermittently. Extremities: 2+ pitting edema on RLE up to mid-calf.  2+ pitting edema on left foot, most significantly dorsum.  1+ LLE edema to ankle.  Erythematous macular rash with dry skin over dorsum of left foot.  Warm to touch over swelling.  Pronounced tenderness to palpation over base of 2nd digit of left foot.  Intact PT pulses bilaterally, DP pulses obstructed by edema.  See pictures below.       ASSESSMENT/PLAN:   Problem List Items Addressed This Visit       Nervous and Auditory   Infantile cerebral palsy (HCC)    Known diagnosis, stable on Keppra for several  years.  Patient will follow-up in 1 month to discuss further.      Relevant Medications   levETIRAcetam (KEPPRA) 500 MG tablet     Other   Lower extremity edema - Primary    New left lower extremity edema.  Differential includes cellulitis versus gout.  Point tenderness more consistent with gout, but widespread edema and rash concerning for cellulitis.  Patient has risk factor for cellulitis due to chronic lower extremity edema. - Initial empirical colchicine treatment, 1.2 mg first dose followed by 0.6 mg twice daily - If not improving with colchicine, cephalexin 500 mg 4 times daily for 6 days available for pickup from pharmacy - Follow-up in 1 month or sooner if not improving - Emergency precautions provided in event of systemic fevers, worsening swelling, severe pain, loss of feeling      Relevant Medications   colchicine 0.6 MG tablet   cephALEXin (KEFLEX) 500 MG capsule   Other Visit Diagnoses     Need for shingles vaccine       Relevant Orders   Zoster Recombinant (Shingrix )      Return in about 1 month (around 10/01/2022) for Keppra/seizure follow up.  Micheal Krone Sharion Dove, MD [facility]

## 2022-08-31 NOTE — Assessment & Plan Note (Signed)
Known diagnosis, stable on Keppra for several years.  Patient will follow-up in 1 month to discuss further.

## 2022-08-31 NOTE — Patient Instructions (Signed)
It was great to see you today! Thank you for choosing [facility] for your primary care.  Today we addressed: Left foot swelling This could be gout or an infection (cellulitis).  Please take the colchicine for gout (2 pills immediately and then 1 more pill an hour later, then 2 pills daily after the first day).  If it does not improve after the first day, please take your cephalexin 4 time daily for 6 days.  Please go to the ED if you experience fevers, your swelling gets worse, if your leg goes numb, or your pain becomes severe.  You should return to our clinic in 1 month for Keppra/seizure follow up.  Thank you for coming to see [place] at [facility] and for the opportunity to care for you! Seraphim Trow, MD 08/31/2022, 3:11 PM  ____________________________________________________  Make sure to check out at the front desk before you leave today.  Please arrive at least 15 minutes prior to your scheduled appointments.  If you need additional refills before your next appointment, please call your pharmacy first.

## 2022-08-31 NOTE — Assessment & Plan Note (Addendum)
New left lower extremity edema.  Differential includes cellulitis versus gout.  Point tenderness more consistent with gout, but widespread edema and rash concerning for cellulitis.  Patient has risk factor for cellulitis due to chronic lower extremity edema. - Initial empirical colchicine treatment, 1.2 mg first dose followed by 0.6 mg twice daily - If not improving with colchicine, cephalexin 500 mg 4 times daily for 6 days available for pickup from pharmacy - Follow-up in 1 month or sooner if not improving - Emergency precautions provided in event of systemic fevers, worsening swelling, severe pain, loss of feeling

## 2022-09-02 ENCOUNTER — Encounter: Payer: Self-pay | Admitting: Family Medicine

## 2022-09-21 ENCOUNTER — Other Ambulatory Visit: Payer: Self-pay

## 2022-09-21 DIAGNOSIS — G809 Cerebral palsy, unspecified: Secondary | ICD-10-CM

## 2022-09-22 MED ORDER — LEVETIRACETAM 500 MG PO TABS: ORAL_TABLET | ORAL | 0 refills | Status: DC

## 2022-09-30 ENCOUNTER — Ambulatory Visit (INDEPENDENT_AMBULATORY_CARE_PROVIDER_SITE_OTHER): Payer: Medicare Other

## 2022-09-30 DIAGNOSIS — R569 Unspecified convulsions: Secondary | ICD-10-CM | POA: Diagnosis not present

## 2022-09-30 DIAGNOSIS — Z0000 Encounter for general adult medical examination without abnormal findings: Secondary | ICD-10-CM | POA: Diagnosis not present

## 2022-09-30 NOTE — Patient Instructions (Signed)
[name] , Thank you for taking time to come for your Medicare Wellness Visit. I appreciate your ongoing commitment to your health goals. Please review the following plan we discussed and let me know if I can assist you in the future.   Referrals/Orders/Follow-Ups/Clinician Recommendations: Aim for 6-8 glasses of water, and 5 servings of fruits and vegetables each day.   This is a list of the screening recommended for you and due dates:  Health Maintenance  Topic Date Due   DTaP/Tdap/Td vaccine (2 - Tdap) 11/22/2014   Zoster (Shingles) Vaccine (1 of 2) Never done   COVID-19 Vaccine (1 - 2023-24 season) Never done   Flu Shot  09/17/2022   Cologuard (Stool DNA test)  07/31/2023   Medicare Annual Wellness Visit  09/30/2023   Hepatitis C Screening  Completed   HIV Screening  Completed   HPV Vaccine  Aged Out    Advanced directives: (ACP Link)Information on Advanced Care Planning can be found at [facility] [place] Advance Health Care Directives Advance Health Care Directives ([email or web address]   Next Medicare Annual Wellness Visit scheduled for next year: Yes  Preventive Care 40-64 Years, Male Preventive care refers to lifestyle choices and visits with your health care provider that can promote health and wellness. What does preventive care include? A yearly physical exam. This is also called an annual well check. Dental exams once or twice a year. Routine eye exams. Ask your health care provider how often you should have your eyes checked. Personal lifestyle choices, including: Daily care of your teeth and gums. Regular physical activity. Eating a healthy diet. Avoiding tobacco and drug use. Limiting alcohol use. Practicing safe sex. Taking low-dose aspirin every day starting at age 50. What happens during an annual well check? The services and screenings done by your health care provider during your annual well check will depend on your age, overall health, lifestyle  risk factors, and family history of disease. Counseling  Your health care provider may ask you questions about your: Alcohol use. Tobacco use. Drug use. Emotional well-being. Home and relationship well-being. Sexual activity. Eating habits. Work and work Astronomer. Screening  You may have the following tests or measurements: Height, weight, and BMI. Blood pressure. Lipid and cholesterol levels. These may be checked every 5 years, or more frequently if you are over 77 years old. Skin check. Lung cancer screening. You may have this screening every year starting at age 29 if you have a 30-pack-year history of smoking and currently smoke or have quit within the past 15 years. Fecal occult blood test (FOBT) of the stool. You may have this test every year starting at age 74. Flexible sigmoidoscopy or colonoscopy. You may have a sigmoidoscopy every 5 years or a colonoscopy every 10 years starting at age 5. Prostate cancer screening. Recommendations will vary depending on your family history and other risks. Hepatitis C blood test. Hepatitis B blood test. Sexually transmitted disease (STD) testing. Diabetes screening. This is done by checking your blood sugar (glucose) after you have not eaten for a while (fasting). You may have this done every 1-3 years. Discuss your test results, treatment options, and if necessary, the need for more tests with your health care provider. Vaccines  Your health care provider may recommend certain vaccines, such as: Influenza vaccine. This is recommended every year. Tetanus, diphtheria, and acellular pertussis (Tdap, Td) vaccine. You may need a Td booster every 10 years. Zoster vaccine. You may need this after age 7.  Pneumococcal 13-valent conjugate (PCV13) vaccine. You may need this if you have certain conditions and have not been vaccinated. Pneumococcal polysaccharide (PPSV23) vaccine. You may need one or two doses if you smoke cigarettes or if you have  certain conditions. Talk to your health care provider about which screenings and vaccines you need and how often you need them. This information is not intended to replace advice given to you by your health care provider. Make sure you discuss any questions you have with your health care provider. Document Released: 03/01/2015 Document Revised: 10/23/2015 Document Reviewed: 12/04/2014 Elsevier Interactive Patient Education  2017 [employer].  Fall Prevention in the Home Falls can cause injuries. They can happen to people of all ages. There are many things you can do to make your home safe and to help prevent falls. What can I do on the outside of my home? Regularly fix the edges of walkways and driveways and fix any cracks. Remove anything that might make you trip as you walk through a door, such as a raised step or threshold. Trim any bushes or trees on the path to your home. Use bright outdoor lighting. Clear any walking paths of anything that might make someone trip, such as rocks or tools. Regularly check to see if handrails are loose or broken. Make sure that both sides of any steps have handrails. Any raised decks and porches should have guardrails on the edges. Have any leaves, snow, or ice cleared regularly. Use sand or salt on walking paths during winter. Clean up any spills in your garage right away. This includes oil or grease spills. What can I do in the bathroom? Use night lights. Install grab bars by the toilet and in the tub and shower. Do not use towel bars as grab bars. Use non-skid mats or decals in the tub or shower. If you need to sit down in the shower, use a plastic, non-slip stool. Keep the floor dry. Clean up any water that spills on the floor as soon as it happens. Remove soap buildup in the tub or shower regularly. Attach bath mats securely with double-sided non-slip rug tape. Do not have throw rugs and other things on the floor that can make you trip. What can  I do in the bedroom? Use night lights. Make sure that you have a light by your bed that is easy to reach. Do not use any sheets or blankets that are too big for your bed. They should not hang down onto the floor. Have a firm chair that has side arms. You can use this for support while you get dressed. Do not have throw rugs and other things on the floor that can make you trip. What can I do in the kitchen? Clean up any spills right away. Avoid walking on wet floors. Keep items that you use a lot in easy-to-reach places. If you need to reach something above you, use a strong step stool that has a grab bar. Keep electrical cords out of the way. Do not use floor polish or wax that makes floors slippery. If you must use wax, use non-skid floor wax. Do not have throw rugs and other things on the floor that can make you trip. What can I do with my stairs? Do not leave any items on the stairs. Make sure that there are handrails on both sides of the stairs and use them. Fix handrails that are broken or loose. Make sure that handrails are as long as the  stairways. Check any carpeting to make sure that it is firmly attached to the stairs. Fix any carpet that is loose or worn. Avoid having throw rugs at the top or bottom of the stairs. If you do have throw rugs, attach them to the floor with carpet tape. Make sure that you have a light switch at the top of the stairs and the bottom of the stairs. If you do not have them, ask someone to add them for you. What else can I do to help prevent falls? Wear shoes that: Do not have high heels. Have rubber bottoms. Are comfortable and fit you well. Are closed at the toe. Do not wear sandals. If you use a stepladder: Make sure that it is fully opened. Do not climb a closed stepladder. Make sure that both sides of the stepladder are locked into place. Ask someone to hold it for you, if possible. Clearly mark and make sure that you can see: Any grab bars or  handrails. First and last steps. Where the edge of each step is. Use tools that help you move around (mobility aids) if they are needed. These include: Canes. Walkers. Scooters. Crutches. Turn on the lights when you go into a dark area. Replace any light bulbs as soon as they burn out. Set up your furniture so you have a clear path. Avoid moving your furniture around. If any of your floors are uneven, fix them. If there are any pets around you, be aware of where they are. Review your medicines with your doctor. Some medicines can make you feel dizzy. This can increase your chance of falling. Ask your doctor what other things that you can do to help prevent falls. This information is not intended to replace advice given to you by your health care provider. Make sure you discuss any questions you have with your health care provider. Document Released: 11/29/2008 Document Revised: 07/11/2015 Document Reviewed: 03/09/2014 Elsevier Interactive Patient Education  2017 [employer].

## 2022-09-30 NOTE — Progress Notes (Signed)
Subjective:   [name] is a 57 y.o. male who presents for an Initial Medicare Annual Wellness Visit.  Visit Complete: Virtual  I connected with  [name] on 09/30/22 by a audio enabled telemedicine application and verified that I am speaking with the correct person using two identifiers.  Patient Location: Home  Provider Location: Home Office  I discussed the limitations of evaluation and management by telemedicine. The patient expressed understanding and agreed to proceed.  Vital Signs: Unable to obtain new vitals due to this being a telehealth visit.   Review of Systems    Cardiac Risk Factors include: sedentary lifestyle;male gender     Objective:    Today's Vitals   There is no height or weight on file to calculate BMI.     09/30/2022    4:37 PM 07/10/2022    1:37 PM 12/30/2021    2:15 PM 10/31/2019    1:34 PM 09/18/2019    4:11 PM 06/28/2017    2:27 PM 06/03/2016    4:23 PM  Advanced Directives  Does Patient Have a Medical Advance Directive? No No No No No No No  Would patient like information on creating a medical advance directive? Yes (MAU/Ambulatory/Procedural Areas - Information given) No - Patient declined No - Patient declined No - Patient declined No - Patient declined No - Patient declined No - Patient declined    Current Medications (verified) Outpatient Encounter Medications as of 09/30/2022  Medication Sig   acetaminophen (TYLENOL ARTHRITIS PAIN) 650 MG CR tablet Take 1,300 mg by mouth daily as needed.    albuterol (ACCUNEB) 1.25 MG/3ML nebulizer solution Take 3 mLs (1.25 mg total) by nebulization every 6 (six) hours as needed for wheezing.   albuterol (PROVENTIL) (2.5 MG/3ML) 0.083% nebulizer solution Inhale 3 mLs (2.5 mg total) into the lungs every 6 (six) hours as needed for wheezing or shortness of breath.   cephALEXin (KEFLEX) 500 MG capsule Take 1 capsule (500 mg total) by mouth 4 (four) times daily.   colchicine 0.6 MG tablet Take 1  tablet (0.6 mg total) by mouth daily. Take 2 pills (1.2 mg) immediately and then another pill 1 hour after if needed, no more than 3 the first day. Then twice daily until 48 hours after resolution.   fluticasone (FLONASE) 50 MCG/ACT nasal spray Place 2 sprays into both nostrils daily.   Fluticasone Furoate (ARNUITY ELLIPTA) 100 MCG/ACT AEPB Inhale 1 puff into the lungs daily as needed.   levETIRAcetam (KEPPRA) 500 MG tablet Take 1 tablet by mouth every other day   loperamide (IMODIUM) 2 MG capsule TAKE 1 CAPSULE DAILY   No facility-administered encounter medications on file as of 09/30/2022.    Allergies (verified) Patient has no known allergies.   History: Past Medical History:  Diagnosis Date   Asthma    albuterol has to rarely usely   Bronchitis    hx of   Cerebral palsy (HCC)    Cervical spondylosis with myelopathy 06/20/2012   Collapse, lung 02/17/2003   left   Gait disorder    Migraine    Migraine    Obesity    Peripheral edema    Pneumonia    hx of   Viral URI with cough 07/01/2017   Vitamin D deficiency    Past Surgical History:  Procedure Laterality Date   ANTERIOR CERVICAL DECOMP/DISCECTOMY FUSION N/A 09/26/2012   Procedure: Cervical six-seven Anterior cervical decompression/diskectomy/fusion;  Surgeon: Barnett Abu, MD;  Location: [facility];  Service: Neurosurgery;  Laterality: N/A;   CERVICAL FUSION     CHOLECYSTECTOMY     INTERTROCHANTERIC HIP FRACTURE SURGERY Right    LAMINECTOMY LUMBAR SPLINE W/ PLACEMENT SPINAL CORD STIMULATOR     LUNG SURGERY  2004   TENDON TRANSFER     Multiple, both legs   Family History  Problem Relation Age of Onset   Cancer - Lung Maternal Uncle    Diabetes Maternal Grandmother    Cancer Maternal Grandfather        Stomach   Social History   Socioeconomic History   Marital status: Single    Spouse name: Not on file   Number of children: 0   Years of education: 2-College   Highest education level: Not on file   Occupational History    Employer: DISABLED  Tobacco Use   Smoking status: Never   Smokeless tobacco: Never  Substance and Sexual Activity   Alcohol use: No   Drug use: Not on file   Sexual activity: Not on file  Other Topics Concern   Not on file  Social History Narrative   Not on file   Social Determinants of Health   Financial Resource Strain: Low Risk  (09/30/2022)   Overall Financial Resource Strain (CARDIA)    Difficulty of Paying Living Expenses: Not hard at all  Food Insecurity: No Food Insecurity (09/30/2022)   Hunger Vital Sign    Worried About Running Out of Food in the Last Year: Never true    Ran Out of Food in the Last Year: Never true  Transportation Needs: No Transportation Needs (09/30/2022)   PRAPARE - Administrator, Civil Service (Medical): No    Lack of Transportation (Non-Medical): No  Physical Activity: Inactive (09/30/2022)   Exercise Vital Sign    Days of Exercise per Week: 0 days    Minutes of Exercise per Session: 0 min  Stress: No Stress Concern Present (09/30/2022)   [employer] of Occupational Health - Occupational Stress Questionnaire    Feeling of Stress : Not at all  Social Connections: Moderately Isolated (09/30/2022)   Social Connection and Isolation Panel [NHANES]    Frequency of Communication with Friends and Family: More than three times a week    Frequency of Social Gatherings with Friends and Family: Three times a week    Attends Religious Services: 1 to 4 times per year    Active Member of Clubs or Organizations: No    Attends Banker Meetings: Never    Marital Status: Never married    Tobacco Counseling Counseling given: Not Answered   Clinical Intake:  Pre-visit preparation completed: Yes  Pain : No/denies pain     Diabetes: No  How often do you need to have someone help you when you read instructions, pamphlets, or other written materials from your doctor or pharmacy?: 1 -  Never  Interpreter Needed?: No  Comments: Assisted with visit by mother -Jamesetta So Information entered by :: Kandis Fantasia LPN   Activities of Daily Living    09/30/2022    2:51 PM  In your present state of health, do you have any difficulty performing the following activities:  Hearing? 0  Vision? 0  Difficulty concentrating or making decisions? 1  Walking or climbing stairs? 1  Dressing or bathing? 0  Doing errands, shopping? 1  Preparing Food and eating ? N  Using the Toilet? N  In the past six months, have you accidently leaked urine? N  Do you have problems with loss of bowel control? N  Managing your Medications? N  Managing your Finances? Y  Housekeeping or managing your Housekeeping? Y    Patient Care Team: Levin Erp, MD as PCP - General (Family Medicine) Rema Jasmine, MD as Referring Physician (Neurology)  Indicate any recent Medical Services you may have received from other than [facility] providers in the past year (date may be approximate).     Assessment:   This is a routine wellness examination for Kelsy.  Hearing/Vision screen Hearing Screening - Comments:: Denies hearing difficulties   Vision Screening - Comments:: No vision problems; will schedule routine eye exam soon    Dietary issues and exercise activities discussed:     Goals Addressed             This Visit's Progress    Remain active        Depression Screen    09/30/2022    2:58 PM 07/10/2022    1:38 PM 12/30/2021    2:16 PM 10/31/2019    1:35 PM 06/28/2017    2:27 PM 04/13/2016    9:41 AM 12/31/2015    4:00 PM  PHQ 2/9 Scores  PHQ - 2 Score 0 0 0 0 0 0 0  PHQ- 9 Score 0 0 0 0       Fall Risk    09/30/2022    4:38 PM 06/28/2017    2:27 PM 05/24/2014   10:40 AM  Fall Risk   Falls in the past year? 0 No No  Number falls in past yr: 0    Injury with Fall? 0    Risk for fall due to : Impaired mobility    Follow up Falls prevention discussed;Education  provided;Falls evaluation completed      MEDICARE RISK AT HOME:  Medicare Risk at Home - 09/30/22 1638     Any stairs in or around the home? No    If so, are there any without handrails? No    Home free of loose throw rugs in walkways, pet beds, electrical cords, etc? Yes    Adequate lighting in your home to reduce risk of falls? Yes    Life alert? No    Use of a cane, walker or w/c? Yes    Grab bars in the bathroom? Yes    Shower chair or bench in shower? Yes    Elevated toilet seat or a handicapped toilet? Yes             TIMED UP AND GO:  Was the test performed? No    Cognitive Function:        09/30/2022    4:38 PM  6CIT Screen  What Year? 0 points  What month? 0 points  What time? 0 points  Count back from 20 0 points  Months in reverse 0 points  Repeat phrase 0 points  Total Score 0 points    Immunizations Immunization History  Administered Date(s) Administered   H1N1 03/14/2008   Influenza Split 12/19/2010, 11/16/2014   Influenza Whole 12/03/2006, 11/22/2007, 11/19/2008, 11/28/2009   Influenza,inj,Quad PF,6+ Mos 10/31/2019   Influenza,inj,Quad PF,6-35 Mos 11/22/2021   Influenza-Unspecified 12/15/2012, 10/24/2018   Pneumococcal Polysaccharide-23 11/16/2005, 11/22/2007   Td 11/21/2004    TDAP status: Due, Education has been provided regarding the importance of this vaccine. Advised may receive this vaccine at local pharmacy or Health Dept. Aware to provide a copy of the vaccination record if obtained from local pharmacy or  Health Dept. Verbalized acceptance and understanding.  Flu Vaccine status: Due, Education has been provided regarding the importance of this vaccine. Advised may receive this vaccine at local pharmacy or Health Dept. Aware to provide a copy of the vaccination record if obtained from local pharmacy or Health Dept. Verbalized acceptance and understanding.  Pneumococcal vaccine status: Up to date  Covid-19 vaccine status: Information  provided on how to obtain vaccines.   Qualifies for Shingles Vaccine? Yes   Zostavax completed No   Shingrix Completed?: No.    Education has been provided regarding the importance of this vaccine. Patient has been advised to call insurance company to determine out of pocket expense if they have not yet received this vaccine. Advised may also receive vaccine at local pharmacy or Health Dept. Verbalized acceptance and understanding.  Screening Tests Health Maintenance  Topic Date Due   DTaP/Tdap/Td (2 - Tdap) 11/22/2014   Zoster Vaccines- Shingrix (1 of 2) Never done   COVID-19 Vaccine (1 - 2023-24 season) Never done   INFLUENZA VACCINE  09/17/2022   Fecal DNA (Cologuard)  07/31/2023   Medicare Annual Wellness (AWV)  09/30/2023   Hepatitis C Screening  Completed   HIV Screening  Completed   HPV VACCINES  Aged Out    Health Maintenance  Health Maintenance Due  Topic Date Due   DTaP/Tdap/Td (2 - Tdap) 11/22/2014   Zoster Vaccines- Shingrix (1 of 2) Never done   COVID-19 Vaccine (1 - 2023-24 season) Never done   INFLUENZA VACCINE  09/17/2022    Colorectal cancer screening: Type of screening: Cologuard. Completed 07/30/20. Repeat every 3 years  Lung Cancer Screening: (Low Dose CT Chest recommended if Age 81-80 years, 20 pack-year currently smoking OR have quit w/in 15years.) does not qualify.   Lung Cancer Screening Referral: n/a  Additional Screening:  Hepatitis C Screening: does qualify; Completed 09/18/19  Vision Screening: Recommended annual ophthalmology exams for early detection of glaucoma and other disorders of the eye. Is the patient up to date with their annual eye exam?  No  Who is the provider or what is the name of the office in which the patient attends annual eye exams? none If pt is not established with a provider, would they like to be referred to a provider to establish care? No .   Dental Screening: Recommended annual dental exams for proper oral  hygiene  Community Resource Referral / Chronic Care Management: CRR required this visit?  No   CCM required this visit?  No    Plan:     I have personally reviewed and noted the following in the patient's chart:   Medical and social history Use of alcohol, tobacco or illicit drugs  Current medications and supplements including opioid prescriptions. Patient is not currently taking opioid prescriptions. Functional ability and status Nutritional status Physical activity Advanced directives List of other physicians Hospitalizations, surgeries, and ER visits in previous 12 months Vitals Screenings to include cognitive, depression, and falls Referrals and appointments  In addition, I have reviewed and discussed with patient certain preventive protocols, quality metrics, and best practice recommendations. A written personalized care plan for preventive services as well as general preventive health recommendations were provided to patient.     Kandis Fantasia [place], [place]   05/25/8117   After Visit Summary: (MyChart) Due to this being a telephonic visit, the after visit summary with patients personalized plan was offered to patient via MyChart   Nurse Notes: Patient is requesting a referral to neurology  for seizure disorder

## 2022-10-01 ENCOUNTER — Ambulatory Visit: Payer: Self-pay | Admitting: Family Medicine

## 2022-10-01 NOTE — Progress Notes (Signed)
Attempted to reach patient. No answer. LVM to call the office to make either a in- person or virtual appt for seizure management and neuro referral. Aquilla Solian, CMA

## 2022-10-02 ENCOUNTER — Telehealth: Payer: Self-pay | Admitting: *Deleted

## 2022-10-02 NOTE — Telephone Encounter (Signed)
Contacted patient.  Virtual appt made for 10/16/22 to discuss referral. Jone Baseman, CMA

## 2022-10-02 NOTE — Telephone Encounter (Signed)
-----   Message from Janit Pagan sent at 09/30/2022  5:01 PM EDT ----- Hello team,  Please contact patient to help her schedule PCP appointment to discuss seizure management and neuro referral. Could be a virtual appointment. Thanks.

## 2022-10-16 ENCOUNTER — Telehealth (INDEPENDENT_AMBULATORY_CARE_PROVIDER_SITE_OTHER): Payer: Medicare Other | Admitting: Student

## 2022-10-16 DIAGNOSIS — Z91199 Patient's noncompliance with other medical treatment and regimen due to unspecified reason: Secondary | ICD-10-CM

## 2022-10-16 NOTE — Progress Notes (Unsigned)
    SUBJECTIVE:   CHIEF COMPLAINT / HPI:   ***  PERTINENT  PMH / PSH: ***  OBJECTIVE:   There were no vitals taken for this visit.  ***  ASSESSMENT/PLAN:   No problem-specific Assessment & Plan notes found for this encounter.     Levin Erp, MD [facility]

## 2022-10-19 ENCOUNTER — Ambulatory Visit
Admission: RE | Admit: 2022-10-19 | Discharge: 2022-10-19 | Disposition: A | Discharge status: Home / Self Care | Payer: Medicare Other | Source: Ambulatory Visit | Attending: Internal Medicine | Admitting: Internal Medicine

## 2022-10-19 ENCOUNTER — Ambulatory Visit: Payer: Self-pay

## 2022-10-19 VITALS — BP 138/86 | HR 98 | Temp 97.6°F | Resp 18

## 2022-10-19 DIAGNOSIS — R197 Diarrhea, unspecified: Secondary | ICD-10-CM | POA: Diagnosis present

## 2022-10-19 NOTE — ED Triage Notes (Signed)
Pt presents with c/o chronic diarrhea x 1 wk. States he has been coughing up excess phlegm and denies fever.

## 2022-10-19 NOTE — ED Provider Notes (Signed)
[facility]    CSN: [id number] Arrival date & time: 10/19/22  0840      History   Chief Complaint Chief Complaint  Patient presents with   Abdominal Pain    stomach pain, diarrhea, body aches - Entered by patient    HPI [name] is a 57 y.o. male presents for diarrhea.  Patient has a history of cerebral palsy and is accompanied by his mother.  Patient reports 3 days of persistent watery diarrhea without blood.  He reports "gurgling" of his abdomen but denies abdominal pain, bloating, cramping.  No fevers or chills.  No nausea/vomiting.  Does report he coughed up excessive phlegm twice since the symptoms began but otherwise denies congestion, cough, ear pain, sore throat.  No recent travel or change in medications or diet.  Reports his wife has tested positive for C. difficile.  He has been taking Imodium with no improvement.  No other concerns at this time.   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   Localized swelling of right lower extremity 09/26/2019   Hypothyroidism 09/26/2019   Hyperglycemia 09/26/2019   Chronic diarrhea 12/31/2015   Elevated bilirubin 06/19/2015   Elevated alkaline phosphatase level 06/10/2015   Migraine 08/25/2013   Seizures (HCC) 08/25/2013   Herniated nucleus pulposus, C6-7 09/26/2012   Cervical spondylosis with myelopathy 06/20/2012   Numbness and tingling of right leg 05/23/2012   Mild intermittent chronic asthma without complication 12/10/2010   Lower  extremity edema 09/01/2010   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    Past Surgical History:  Procedure Laterality Date   ANTERIOR CERVICAL DECOMP/DISCECTOMY FUSION N/A 09/26/2012   Procedure: Cervical six-seven Anterior cervical decompression/diskectomy/fusion;  Surgeon: Barnett Abu, MD;  Location: [facility];  Service: Neurosurgery;  Laterality: N/A;   CERVICAL FUSION     CHOLECYSTECTOMY     INTERTROCHANTERIC HIP FRACTURE SURGERY Right    LAMINECTOMY LUMBAR SPLINE W/ PLACEMENT SPINAL CORD STIMULATOR     LUNG SURGERY  2004   TENDON TRANSFER     Multiple, both legs       Home Medications    Prior to Admission medications   Medication Sig Start Date End Date Taking? Authorizing Provider  acetaminophen (TYLENOL ARTHRITIS PAIN) 650 MG CR tablet Take 1,300 mg by mouth daily as needed.     [provider]  albuterol (ACCUNEB) 1.25 MG/3ML nebulizer solution Take 3 mLs (1.25 mg total) by nebulization every 6 (six) hours as needed for wheezing. 02/23/20   Peggyann Shoals C, DO  albuterol (PROVENTIL) (2.5 MG/3ML) 0.083% nebulizer solution Inhale 3 mLs (2.5 mg total) into the lungs every 6 (six) hours as needed for wheezing or shortness of breath. 12/30/21   Ganta, Anupa, DO  cephALEXin (KEFLEX) 500 MG capsule Take 1 capsule (500 mg total) by mouth 4 (four) times daily. 08/31/22   Shitarev, Dimitry,  MD  colchicine 0.6 MG tablet Take 1 tablet (0.6 mg total) by mouth daily. Take 2 pills (1.2 mg) immediately and then another pill 1 hour after if needed, no more than 3 the first day. Then twice daily until 48 hours after resolution. 08/31/22   Shitarev, Dimitry, MD  fluticasone (FLONASE) 50 MCG/ACT nasal spray Place 2 sprays into both nostrils daily. 02/19/22   Sabino Dick, DO  Fluticasone Furoate (ARNUITY ELLIPTA) 100 MCG/ACT AEPB Inhale 1 puff into the lungs daily as needed. 03/22/22   Reece Leader, DO  levETIRAcetam (KEPPRA) 500 MG tablet Take 1 tablet by mouth every other day  09/22/22   Levin Erp, MD  loperamide (IMODIUM) 2 MG capsule TAKE 1 CAPSULE DAILY 07/31/22   Sabino Dick, DO    Family History Family History  Problem Relation Age of Onset   Cancer - Lung Maternal Uncle    Diabetes Maternal Grandmother    Cancer Maternal Grandfather        Stomach    Social History Social History   Tobacco Use   Smoking status: Never   Smokeless tobacco: Never  Substance Use Topics   Alcohol use: No     Allergies   Patient has no known allergies.   Review of Systems Review of Systems  Gastrointestinal:  Positive for diarrhea.     Physical Exam Triage Vital Signs ED Triage Vitals  Encounter Vitals Group     BP 10/19/22 0847 138/86     Systolic BP Percentile --      Diastolic BP Percentile --      Pulse Rate 10/19/22 0847 98     Resp 10/19/22 0847 18     Temp 10/19/22 0847 97.6 F (36.4 C)     Temp Source 10/19/22 0847 Oral     SpO2 10/19/22 0847 91 %     Weight --      Height --      Head Circumference --      Peak Flow --      Pain Score 10/19/22 0846 0     Pain Loc --      Pain Education --      Exclude from Growth Chart --    No data found.  Updated Vital Signs BP 138/86 (BP Location: Right Arm)   Pulse 98   Temp 97.6 F (36.4 C) (Oral)   Resp 18   SpO2 91%   Visual Acuity Right Eye Distance:   Left Eye Distance:   Bilateral Distance:    Right Eye Near:   Left Eye Near:    Bilateral Near:     Physical Exam Vitals and nursing note reviewed.  Constitutional:      General: He is not in acute distress.    Appearance: Normal appearance. He is not ill-appearing.  Eyes:     Pupils: Pupils are equal, round, and reactive to light.  Cardiovascular:     Rate and Rhythm: Normal rate.  Pulmonary:     Effort: Pulmonary effort is normal.  Abdominal:     General: Bowel sounds are normal. There is no distension.     Palpations: Abdomen is soft.     Tenderness: There is no abdominal tenderness. There is no guarding  or rebound.  Skin:    General: Skin is warm and dry.  Neurological:     General: No focal deficit present.     Mental Status: He is alert and oriented to person, place, and time.  Psychiatric:  Mood and Affect: Mood normal.        Behavior: Behavior normal.      UC Treatments / Results  Labs (all labs ordered are listed, but only abnormal results are displayed) Labs Reviewed - No data to display  EKG   Radiology No results found.  Procedures Procedures (including critical care time)  Medications Ordered in UC Medications - No data to display  Initial Impression / Assessment and Plan / UC Course  I have reviewed the triage vital signs and the nursing notes.  Pertinent labs & imaging results that were available during my care of the patient were reviewed by me and considered in my medical decision making (see chart for details).     Reviewed exam and symptoms with patient.  No red flags.  Will do C. difficile testing given close exposure.  Patient does state he thinks the diarrhea is "slowing down".  He may continue Imodium as needed.  Discussed hydration/electrolyte replacement.  PCP follow-up in 2 days for recheck.  ER precautions reviewed and patient and his mother verbalized understanding. Final Clinical Impressions(s) / UC Diagnoses   Final diagnoses:  Diarrhea, unspecified type     Discharge Instructions      Bring a stool sample at your earliest convenience so we can send that to the lab to check C. difficile given your exposure.  He may continue Imodium as needed.  Lots of fluids with Gatorade and, Powerade, Pedialyte.  Please follow-up with your PCP in 2 days for recheck.  Please go to the emergency room for any worsening symptoms.  I hope you feel better soon!    ED Prescriptions   None    PDMP not reviewed this encounter.   Radford Pax, NP 10/19/22 [phone]

## 2022-10-19 NOTE — Discharge Instructions (Signed)
Bring a stool sample at your earliest convenience so we can send that to the lab to check C. difficile given your exposure.  He may continue Imodium as needed.  Lots of fluids with Gatorade and, Powerade, Pedialyte.  Please follow-up with your PCP in 2 days for recheck.  Please go to the emergency room for any worsening symptoms.  I hope you feel better soon!

## 2022-10-20 ENCOUNTER — Telehealth: Payer: Self-pay

## 2022-10-20 ENCOUNTER — Telehealth: Payer: Self-pay | Admitting: Nurse Practitioner

## 2022-10-20 DIAGNOSIS — A498 Other bacterial infections of unspecified site: Secondary | ICD-10-CM

## 2022-10-20 LAB — C DIFFICILE QUICK SCREEN W PCR REFLEX
C Diff antigen: POSITIVE — AB
C Diff interpretation: POSITIVE
C Diff toxin: POSITIVE — AB

## 2022-10-20 MED ORDER — FIDAXOMICIN 200 MG PO TABS: 200.0000 mg | ORAL_TABLET | Freq: Two times a day (BID) | ORAL | 0 refills | Status: AC

## 2022-10-20 NOTE — Telephone Encounter (Signed)
Patient positive C. difficile.  Rx fidaxomicin sent to pharmacy.  Nursing staff to inform patient.  Patient to follow-up with PCP in 2 days for recheck.  ER precautions reviewed at time of visit.

## 2022-10-20 NOTE — Telephone Encounter (Signed)
Pts Mother called requesting antibiotics for the pt positive C diff results. Notified Medical Provider. Pt requesting antibiotics sent to [facility] on [address].

## 2022-10-20 NOTE — Telephone Encounter (Signed)
Lab called to report +Cdiff stool cultuer from 10/19/22-Jodi Stacie Acres, NP notified

## 2022-10-21 ENCOUNTER — Telehealth: Payer: Self-pay

## 2022-10-21 NOTE — Telephone Encounter (Signed)
Patient has an appt 09/13 with you at 850

## 2022-10-21 NOTE — Telephone Encounter (Signed)
-----   Message from [facility] sent at 10/20/2022  7:24 PM EDT ----- Regarding: C diff follow up Pt was diagnosed with c diff and started treatment from urgent care. Needs follow up this week to assess fluid status.  Thank you!

## 2022-10-30 ENCOUNTER — Telehealth (INDEPENDENT_AMBULATORY_CARE_PROVIDER_SITE_OTHER): Payer: Medicare Other | Admitting: Student

## 2022-10-30 DIAGNOSIS — R569 Unspecified convulsions: Secondary | ICD-10-CM | POA: Diagnosis not present

## 2022-10-30 DIAGNOSIS — G809 Cerebral palsy, unspecified: Secondary | ICD-10-CM

## 2022-10-30 DIAGNOSIS — A0472 Enterocolitis due to Clostridium difficile, not specified as recurrent: Secondary | ICD-10-CM | POA: Diagnosis not present

## 2022-10-30 MED ORDER — LEVETIRACETAM 500 MG PO TABS: ORAL_TABLET | ORAL | 0 refills | Status: DC

## 2022-10-30 NOTE — Patient Instructions (Signed)
It was great to see you! Thank you for allowing me to participate in your care!   Our plans for today:  - I am placing a referral to neurology - I refilled your keppra in the meant time - I will touch base with you about this diarrhea  Take care and seek immediate care sooner if you develop any concerns.  Levin Erp, MD

## 2022-10-30 NOTE — Progress Notes (Signed)
[facility] Telemedicine Visit  Patient consented to have virtual visit and was identified by name and date of birth. Method of visit: Video was attempted but was interrupted: <50% of visit completed via video  Encounter participants: Patient: [name] - located at home Provider: Levin Erp - located at [facility] Others (if applicable): mother [name]  Chief Complaint: Follow up for neurology referral and C. difficile  HPI:  Seizure history-requests neurology referral for seizure management He is on Keppra 500 every other day but actually takes it only every Thursday Per mom he was put on it around 4 years ago due to episodes where he "spaces out" for a while Spaces out for around 30 seconds 1-2 times a week per mom States that he is very controlled on Keppra but he is running out soon Open to seeing any neurologist  Was C. difficile positive on 10/20/2022 in urgent care-was sent in prescription for fidaxomicin which he completed today.  States that it got better around 5 days of treatment but then it got worse after that. Since then patient states diarrhea is still present. 10 episodes of diarrhea yesterday.  States that he is feeling great.  Doesn't have a gallbladder and wonders if this has something to do with diarrhea.  He does state though that his normal bowel movement pattern is 2-3 times a week so this is much greater than his normal.  Denies any vomiting or abdominal pain.  He has been staying very well-hydrated.  He is able to hold down all of his food and eating normally.  He took Imodium 1 time but has not taken it since then.  ROS: per HPI  Pertinent PMHx: Infantile cerebral palsy, seizures  Exam:  There were no vitals taken for this visit.  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  Only taking Keppra once a week which is an unusual  regimen. -Refilled Keppra -Neurology referral placed   C. difficile diarrhea Recently completed fidaxomicin course today.  Continues to have copious diarrhea.  Does have chronic diarrhea listed in his problem list however upon discussion it appears to have only 2-3 bowel movements weekly as his normal.  Could consider component of steatorrhea given no gallbladder.  I discussed red flags with patient and mother including abdominal pain, vomiting, unable to hold down liquids or vomiting, fever to return to care.  I have called them and scheduled him an appointment for Monday for follow-up to see if diarrhea is improved.  I discussed I would hold off on restarting Imodium given risk of possible toxic megacolon. -Follow-up on Monday -ED/return precautions discussed in depth  Time spent during visit with patient: 30 minutes

## 2022-10-30 NOTE — Assessment & Plan Note (Signed)
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  Only taking Keppra once a week which is an unusual regimen. -Refilled Keppra -Neurology referral placed

## 2022-11-02 ENCOUNTER — Ambulatory Visit (INDEPENDENT_AMBULATORY_CARE_PROVIDER_SITE_OTHER): Payer: Self-pay

## 2022-11-02 ENCOUNTER — Telehealth: Payer: Self-pay

## 2022-11-02 VITALS — BP 126/96 | HR 99 | Temp 97.5°F

## 2022-11-02 DIAGNOSIS — A0472 Enterocolitis due to Clostridium difficile, not specified as recurrent: Secondary | ICD-10-CM

## 2022-11-02 MED ORDER — VANCOMYCIN HCL 125 MG PO CAPS: 125.0000 mg | ORAL_CAPSULE | Freq: Four times a day (QID) | ORAL | 0 refills | Status: AC

## 2022-11-02 NOTE — Telephone Encounter (Signed)
Attempted to reach infectious disease center. No answer. LVM. For the to call my desk number. To see if we can set on an appt. For further tx for patient due to failed antibiotic treatment.  Aquilla Solian, CMA

## 2022-11-02 NOTE — Addendum Note (Signed)
Addended byBarb Merino, Jashon Ishida on: 11/02/2022 02:02 PM   Modules accepted: Orders

## 2022-11-02 NOTE — Telephone Encounter (Signed)
Patients mother calls nurse line in regards to ID referral.   She reports he has an apt for 9/24 in [place], however was told by "someone" they would look into other locations. She reports "the girl" mentioned [place].   Mother reports they do not want to wait until next week if at all possible.   Unsure who called patient, however will send to referral coordinator.

## 2022-11-02 NOTE — Assessment & Plan Note (Addendum)
Refractory to fidaxomicin treatment.  Reassuringly afebrile today without clinical signs of dehydration.  Has been tolerating fluid intake but is still having significant diarrhea.  Feel he would benefit from infectious disease evaluation given failure of initial treatment.  Placed urgent referral to ID.  Provided strict return precautions  Addendum 2pm: May have some difficulty getting in with ID soon - discussed with our pharmacist and attending Dr. Jennette Kettle, will start oral vancomycin 125mg  QID x10 days at least until he is able to see ID. Called patient's mother to discuss, she agrees with plan, sent prescription

## 2022-11-02 NOTE — Telephone Encounter (Signed)
Contacted [facility] and [facility], both are scheduling "a couple to a few weeks out".  Mother informed and will keep appt for next week. Jone Baseman, CMA

## 2022-11-02 NOTE — Progress Notes (Addendum)
    SUBJECTIVE:   CHIEF COMPLAINT / HPI:   C. difficile follow-up.  Positive for C. difficile on 9/3 in urgent care, was prescribed fidaxomicin which he completed 9/13.  Reevaluated 9/13 at which time he continued to have copious diarrhea and was advised to follow-up today. Reports 4x diarrhea in last 24hrs, watery and brown, no fatty/yellow deposits noted, no hematochezia or hematuria Imodium has not been helping Is not currently taking any antibiotics Reports that his partner is also wheelchair-bound and tested positive for C. difficile initially, she was treated with vancomycin   PERTINENT  PMH / PSH: History of cerebral palsy, wheelchair-bound  OBJECTIVE:   BP (!) 126/96   Pulse 99   Temp (!) 97.5 F (36.4 C) (Oral)   SpO2 97%    General: NAD, pleasant, wheelchair-bound Respiratory: No respiratory distress Abd: Soft, NT/ND Skin: warm and dry, normal capillary refill Psych: Normal affect and mood  ASSESSMENT/PLAN:   Assessment & Plan C. difficile diarrhea Refractory to fidaxomicin treatment.  Reassuringly afebrile today without clinical signs of dehydration.  Has been tolerating fluid intake but is still having significant diarrhea.  Feel he would benefit from infectious disease evaluation given failure of initial treatment.  Placed urgent referral to ID.  Provided strict return precautions  Addendum 2pm: May have some difficulty getting in with ID soon - discussed with our pharmacist and attending Dr. Jennette Kettle, will start oral vancomycin 125mg  QID x10 days at least until he is able to see ID. Called patient's mother to discuss, she agrees with plan, sent prescription    Vonna Drafts, MD [facility]

## 2022-11-02 NOTE — Patient Instructions (Signed)
I placed referral to infectious disease for further workup of your infection  You should get a call soon about arranging this appointment  Please go to the hospital if you are unable to keep any fluids or have severe abdominal pain, dizziness, or lightheadedness

## 2022-11-03 NOTE — Telephone Encounter (Signed)
Spoke with a Kim at infectious Disease and she stated that patient has an appt on Sept. 24th at 2:00pm with Dr. Renold Don. Patients mother is aware of appt. Aquilla Solian, CMA

## 2022-11-06 ENCOUNTER — Encounter: Payer: Self-pay | Admitting: Family Medicine

## 2022-11-10 ENCOUNTER — Ambulatory Visit
Admission: RE | Admit: 2022-11-10 | Discharge: 2022-11-10 | Disposition: A | Discharge status: Home / Self Care | Payer: Medicare Other | Source: Ambulatory Visit | Attending: Internal Medicine

## 2022-11-10 ENCOUNTER — Other Ambulatory Visit: Payer: Self-pay

## 2022-11-10 ENCOUNTER — Ambulatory Visit (INDEPENDENT_AMBULATORY_CARE_PROVIDER_SITE_OTHER): Payer: Medicare Other | Admitting: Internal Medicine

## 2022-11-10 VITALS — BP 131/84 | HR 97 | Temp 97.8°F | Resp 16

## 2022-11-10 DIAGNOSIS — A0472 Enterocolitis due to Clostridium difficile, not specified as recurrent: Secondary | ICD-10-CM | POA: Diagnosis present

## 2022-11-10 NOTE — Progress Notes (Signed)
[facility]  Reason for Consult:c diff diarrhea Referring Provider: Vonna Drafts    Patient Active Problem List   Diagnosis Date Noted   C. 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   Localized swelling of right lower extremity 09/26/2019   Hypothyroidism 09/26/2019   Hyperglycemia 09/26/2019   Chronic diarrhea 12/31/2015   Elevated bilirubin 06/19/2015   Elevated alkaline phosphatase level 06/10/2015   Migraine 08/25/2013   Seizures (HCC) 08/25/2013   Herniated nucleus pulposus, C6-7 09/26/2012   Cervical spondylosis with myelopathy 06/20/2012   Numbness and tingling of right leg 05/23/2012   Mild intermittent chronic asthma without complication 12/10/2010   Lower extremity edema 09/01/2010   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      HPI: [name] is a 57 y.o. male hx cerebral palsy, referred by his primary care team from the family medicine teaching service for diarrhea presumed cdiff  He was seen in clinic 11/02/22: I reviewed chart 10/19/22 urgent care visit dx'ed with cdiff. Had 3 day of watery nonbloody diarrhea before this visit. No fever, chill, abd pain, n/v. Given 10 day fidaxomycin. Of note, he was taking immodium prior to cdiff testing 11/02/22 office pcp visit. Continued to have 4 diarrhea a day watery and nonbloody. Taking immodium. Mention wife was also tested positive for cdiff and given po vancomycin. Started on 10 day of PO vancomycin; she was positive 2 weeks before cdiff  It appears at some point he was having up to 10 diarrhea a day before the fidaxomycin was started   9/2 lab showed a cdiff antigen/toxin positive. But didn't deliniate pcr or non-pcr based for toxin testing  ----- Patient is here with his mother today. He is in a wheel chair  He hasn't had a bm for 5 days. Today developing abd pain, nausea. He is still taking immodium 2 pills once a day. 2-4 out of 10 for sx  severity  He has a couple days left with vancomycin.      Appetite is improving  No fever, chill   This is the first time he had cdiff   Review of Systems: ROS All other ros negative      Past Medical History:  Diagnosis Date   Asthma    albuterol has to rarely usely   Bronchitis    hx of   Cerebral palsy (HCC)    Cervical spondylosis with myelopathy 06/20/2012   Collapse, lung 02/17/2003   left   Gait disorder    Migraine    Migraine    Obesity    Peripheral edema    Pneumonia    hx of   Viral URI with cough 07/01/2017   Vitamin D deficiency     Social History   Tobacco Use   Smoking status: Never   Smokeless tobacco: Never  Substance Use Topics   Alcohol use: No    Family History  Problem Relation Age of Onset   Cancer - Lung Maternal Uncle    Diabetes Maternal Grandmother    Cancer Maternal Grandfather        Stomach    No Known Allergies  OBJECTIVE: There were no vitals filed for this visit. There is no height or weight on file to calculate BMI.   Physical Exam General/constitutional: no distress, pleasant HEENT: Normocephalic, PER, strabismus baseline, Oropharynx clear Neck supple CV: rrr no mrg Lungs: clear to auscultation, normal respiratory effort Abd: Soft, Nontender Ext:  no edema Skin: No Rash Neuro: extremites contracture; can't ambulate MSK: no peripheral joint swelling/tenderness/warmth; back spines nontender      Lab:  Microbiology:  Serology:  Imaging:   Assessment/plan: Problem List Items Addressed This Visit   None Visit Diagnoses     Clostridium difficile colitis    -  Primary   Relevant Orders   CBC w/Diff   COMPLETE METABOLIC PANEL WITH GFR   DG Abd 1 View         Discuss treatment/evolution of cdiff I have no blood test while he had cdiff  While he could have had cdiff, his diarrhea could be due to other causes. His cdiff testing again unclear if it is non-pcr based.   Today's  constipation x5 days seems to be related to immodium rather than severe cdiff. He clinically appears well  We will also do a kub  Mychart visit next       Follow-up: No follow-ups on file.  Raymondo Band, MD [facility] 11/10/2022, 1:51 PM

## 2022-11-10 NOTE — Patient Instructions (Signed)
Will get labs and xray to get a better overview of what's going on in terms of potential complication, severity of cdiff   Stop immodium  Finish your 2 more days of vancomycin   Expect some irritable bowel syndrome like picture after cdiff treatment  (fluctuating diarrhea/constipation and abdominal spasm)   Set up a video visit with me next week

## 2022-11-11 LAB — CBC WITH DIFFERENTIAL/PLATELET
Absolute Monocytes: 409 cells/uL (ref 200–950)
Basophils Absolute: 37 cells/uL (ref 0–200)
Basophils Relative: 0.4 %
Eosinophils Absolute: 28 cells/uL (ref 15–500)
Eosinophils Relative: 0.3 %
HCT: 46.5 % (ref 38.5–50.0)
Hemoglobin: 15.5 g/dL (ref 13.2–17.1)
Lymphs Abs: 614 cells/uL — ABNORMAL LOW (ref 850–3900)
MCH: 30.7 pg (ref 27.0–33.0)
MCHC: 33.3 g/dL (ref 32.0–36.0)
MCV: 92.1 fL (ref 80.0–100.0)
MPV: 11.4 fL (ref 7.5–12.5)
Monocytes Relative: 4.4 %
Neutro Abs: 8212 cells/uL — ABNORMAL HIGH (ref 1500–7800)
Neutrophils Relative %: 88.3 %
Platelets: 259 10*3/uL (ref 140–400)
RBC: 5.05 10*6/uL (ref 4.20–5.80)
RDW: 12.5 % (ref 11.0–15.0)
Total Lymphocyte: 6.6 %
WBC: 9.3 10*3/uL (ref 3.8–10.8)

## 2022-11-11 LAB — COMPLETE METABOLIC PANEL WITH GFR
AG Ratio: 2.6 (calc) — ABNORMAL HIGH (ref 1.0–2.5)
ALT: 26 U/L (ref 9–46)
AST: 30 U/L (ref 10–35)
Albumin: 4.5 g/dL (ref 3.6–5.1)
Alkaline phosphatase (APISO): 135 U/L (ref 35–144)
BUN/Creatinine Ratio: 17 (calc) (ref 6–22)
BUN: 10 mg/dL (ref 7–25)
CO2: 34 mmol/L — ABNORMAL HIGH (ref 20–32)
Calcium: 9.5 mg/dL (ref 8.6–10.3)
Chloride: 97 mmol/L — ABNORMAL LOW (ref 98–110)
Creat: 0.6 mg/dL — ABNORMAL LOW (ref 0.70–1.30)
Globulin: 1.7 g/dL (calc) — ABNORMAL LOW (ref 1.9–3.7)
Glucose, Bld: 94 mg/dL (ref 65–99)
Potassium: 3.4 mmol/L — ABNORMAL LOW (ref 3.5–5.3)
Sodium: 143 mmol/L (ref 135–146)
Total Bilirubin: 2 mg/dL — ABNORMAL HIGH (ref 0.2–1.2)
Total Protein: 6.2 g/dL (ref 6.1–8.1)
eGFR: 115 mL/min/{1.73_m2} (ref >=60)

## 2022-11-18 ENCOUNTER — Telehealth: Payer: Medicare Other | Admitting: Internal Medicine

## 2022-11-18 DIAGNOSIS — A0472 Enterocolitis due to Clostridium difficile, not specified as recurrent: Secondary | ICD-10-CM

## 2022-11-18 NOTE — Progress Notes (Signed)
[facility]  Cc f/u cdiff    Patient Active Problem List   Diagnosis Date Noted   C. 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   Localized swelling of right lower extremity 09/26/2019   Hypothyroidism 09/26/2019   Hyperglycemia 09/26/2019   Chronic diarrhea 12/31/2015   Elevated bilirubin 06/19/2015   Elevated alkaline phosphatase level 06/10/2015   Migraine 08/25/2013   Seizures (HCC) 08/25/2013   Herniated nucleus pulposus, C6-7 09/26/2012   Cervical spondylosis with myelopathy 06/20/2012   Numbness and tingling of right leg 05/23/2012   Mild intermittent chronic asthma without complication 12/10/2010   Lower extremity edema 09/01/2010   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      HPI: [name] is a 57 y.o. male hx cerebral palsy, referred by his primary care team from the family medicine teaching service for diarrhea presumed cdiff  He was seen in clinic 11/02/22: I reviewed chart 10/19/22 urgent care visit dx'ed with cdiff. Had 3 day of watery nonbloody diarrhea before this visit. No fever, chill, abd pain, n/v. Given 10 day fidaxomycin. Of note, he was taking immodium prior to cdiff testing 11/02/22 office pcp visit. Continued to have 4 diarrhea a day watery and nonbloody. Taking immodium. Mention wife was also tested positive for cdiff and given po vancomycin. Started on 10 day of PO vancomycin; she was positive 2 weeks before cdiff  It appears at some point he was having up to 10 diarrhea a day before the fidaxomycin was started   9/2 lab showed a cdiff antigen/toxin positive. But didn't deliniate pcr or non-pcr based for toxin testing  ----- Patient is here with his mother today. He is in a wheel chair  He hasn't had a bm for 5 days. Today developing abd pain, nausea. He is still taking immodium 2 pills once a day. 2-4 out of 10 for sx severity  He has a couple days left with vancomycin.       Appetite is improving  No fever, chill   This is the first time he had cdiff  -------- 11/18/22 id clinic f/u See A&P Mychart visit today   Review of Systems: ROS All other ros negative      Past Medical History:  Diagnosis Date   Asthma    albuterol has to rarely usely   Bronchitis    hx of   Cerebral palsy (HCC)    Cervical spondylosis with myelopathy 06/20/2012   Collapse, lung 02/17/2003   left   Gait disorder    Migraine    Migraine    Obesity    Peripheral edema    Pneumonia    hx of   Viral URI with cough 07/01/2017   Vitamin D deficiency     Social History   Tobacco Use   Smoking status: Never   Smokeless tobacco: Never  Substance Use Topics   Alcohol use: No    Family History  Problem Relation Age of Onset   Cancer - Lung Maternal Uncle    Diabetes Maternal Grandmother    Cancer Maternal Grandfather        Stomach    No Known Allergies  OBJECTIVE: There were no vitals filed for this visit. There is no height or weight on file to calculate BMI.  Video visit In wheel chair; conversant; no distress Mother by his side   Lab: Lab Results  Component Value Date   WBC 9.3 11/10/2022  HGB 15.5 11/10/2022   HCT 46.5 11/10/2022   MCV 92.1 11/10/2022   PLT 259 11/10/2022    Last metabolic panel Lab Results  Component Value Date   GLUCOSE 94 11/10/2022   NA 143 11/10/2022   K 3.4 (L) 11/10/2022   CL 97 (L) 11/10/2022   CO2 34 (H) 11/10/2022   BUN 10 11/10/2022   CREATININE 0.60 (L) 11/10/2022   EGFR 115 11/10/2022   CALCIUM 9.5 11/10/2022   PROT 6.2 11/10/2022   ALBUMIN 4.9 09/18/2019   LABGLOB 1.8 09/18/2019   AGRATIO 2.7 (H) 09/18/2019   BILITOT 2.0 (H) 11/10/2022   ALKPHOS 139 (H) 09/18/2019   AST 30 11/10/2022   ALT 26 11/10/2022   ANIONGAP 7 07/15/2020    Microbiology:  Serology:  Imaging:   Assessment/plan: Problem List Items Addressed This Visit   None Visit Diagnoses     C. difficile  colitis    -  Primary          Discuss treatment/evolution of cdiff I have no blood test while he had cdiff  While he could have had cdiff, his diarrhea could be due to other causes. His cdiff testing again unclear if it is non-pcr based.   Today's constipation x5 days seems to be related to immodium rather than severe cdiff. He clinically appears well  We will also do a kub  Mychart visit next    ---------------- 11/18/2022 Getting back to 100%  Last bm solid stool 2 days ago No n/v/fever/chill Not taking immodium Reviewed labs from last visit and kub Kub showed constipation  Advise to resume normal activities and increased fiber in diet and also for the next several days prune juice to help with bowel movement  No need for f/u  I connected with  [name] on 11/18/2022  I verified that I was speaking with the correct person using two identifiers. Due to the COVID-19 Pandemic/nature of visit, this service was provided via telemedicine using audio/visual media.   The patient was located at home. The provider was located in the office. The patient did consent to this visit and is aware of charges through their insurance as well as the limitations of evaluation and management by telemedicine. Other persons participating in this telemedicine service were none. Time spent on visit was greater than 20 minutes on media and in coordination of care     Follow-up: No follow-ups on file.  Raymondo Band, MD [facility] 11/18/2022, 4:23 PM

## 2022-12-11 ENCOUNTER — Other Ambulatory Visit: Payer: Self-pay | Admitting: Family Medicine

## 2022-12-11 DIAGNOSIS — R600 Localized edema: Secondary | ICD-10-CM

## 2022-12-13 ENCOUNTER — Other Ambulatory Visit: Payer: Self-pay | Admitting: Student

## 2022-12-13 DIAGNOSIS — R569 Unspecified convulsions: Secondary | ICD-10-CM

## 2023-01-02 ENCOUNTER — Telehealth: Payer: Medicare Other | Admitting: Nurse Practitioner

## 2023-01-02 DIAGNOSIS — A0472 Enterocolitis due to Clostridium difficile, not specified as recurrent: Secondary | ICD-10-CM | POA: Diagnosis not present

## 2023-01-02 MED ORDER — VANCOMYCIN HCL 125 MG PO CAPS
125.0000 mg | ORAL_CAPSULE | Freq: Four times a day (QID) | ORAL | 0 refills | Status: AC
Start: 2023-01-02 — End: 2023-01-12

## 2023-01-02 NOTE — Patient Instructions (Signed)
[name], thank you for joining Claiborne Rigg, NP for today's virtual visit.  While this provider is not your primary care provider (PCP), if your PCP is located in our provider database this encounter information will be shared with them immediately following your visit.   A [facility] MyChart account gives you access to today's visit and all your visits, tests, and labs performed at [facility] " click here if you don't have a [facility] MyChart account or go to [email or web address]  Consent: (Patient) [name] provided verbal consent for this virtual visit at the beginning of the encounter.  Current Medications:  Current Outpatient Medications:    vancomycin (VANCOCIN) 125 MG capsule, Take 1 capsule (125 mg total) by mouth 4 (four) times daily for 10 days., Disp: 40 capsule, Rfl: 0   acetaminophen (TYLENOL ARTHRITIS PAIN) 650 MG CR tablet, Take 1,300 mg by mouth daily as needed. , Disp: , Rfl:    albuterol (ACCUNEB) 1.25 MG/3ML nebulizer solution, Take 3 mLs (1.25 mg total) by nebulization every 6 (six) hours as needed for wheezing., Disp: 75 mL, Rfl: 3   albuterol (PROVENTIL) (2.5 MG/3ML) 0.083% nebulizer solution, Inhale 3 mLs (2.5 mg total) into the lungs every 6 (six) hours as needed for wheezing or shortness of breath., Disp: 75 mL, Rfl: 0   cephALEXin (KEFLEX) 500 MG capsule, Take 1 capsule (500 mg total) by mouth 4 (four) times daily., Disp: 24 capsule, Rfl: 0   colchicine 0.6 MG tablet, Take 1 tablet (0.6 mg total) by mouth daily. Take 2 pills (1.2 mg) immediately and then another pill 1 hour after if needed, no more than 3 the first day. Then twice daily until 48 hours after resolution., Disp: 11 tablet, Rfl: 0   fluticasone (FLONASE) 50 MCG/ACT nasal spray, Place 2 sprays into both nostrils daily., Disp: 16 g, Rfl: 6   Fluticasone Furoate (ARNUITY ELLIPTA) 100 MCG/ACT AEPB, Inhale 1 puff into the lungs daily as needed., Disp: 30 each, Rfl: 11    levETIRAcetam (KEPPRA) 500 MG tablet, TAKE 1 TABLET EVERY OTHER  DAY, Disp: 45 tablet, Rfl: 0   loperamide (IMODIUM) 2 MG capsule, TAKE 1 CAPSULE DAILY, Disp: 90 capsule, Rfl: 2   Medications ordered in this encounter:  Meds ordered this encounter  Medications   vancomycin (VANCOCIN) 125 MG capsule    Sig: Take 1 capsule (125 mg total) by mouth 4 (four) times daily for 10 days.    Dispense:  40 capsule    Refill:  0    Order Specific Question:   Supervising Provider    Answer:   Merrilee Jansky [id number]     *If you need refills on other medications prior to your next appointment, please contact your pharmacy*  Follow-Up: Call back or seek an in-person evaluation if the symptoms worsen or if the condition fails to improve as anticipated.  [facility] [phone]  Other Instructions Patient and significant other were instructed to reach out to Dr. Renold Don on Monday regarding stools and if appropriate to continue vancomycin.  They verbalized agreement.   If you have been instructed to have an in-person evaluation today at a local Urgent Care facility, please use the link below. It will take you to a list of all of our available [facility], including address, phone number and hours of operation. Please do not delay care.   Urgent Cares  If you or a family member do not have  a primary care provider, use the link below to schedule a visit and establish care. When you choose a [facility] primary care physician or advanced practice provider, you gain a long-term partner in health. Find a Primary Care Provider  Learn more about [facility]'s in-office and virtual care options: [facility] - Get Care Now

## 2023-01-02 NOTE — Progress Notes (Signed)
Virtual Visit Consent   [name], you are scheduled for a virtual visit with a [facility] provider today. Just as with appointments in the office, your consent must be obtained to participate. Your consent will be active for this visit and any virtual visit you may have with one of our providers in the next 365 days. If you have a MyChart account, a copy of this consent can be sent to you electronically.  As this is a virtual visit, video technology does not allow for your provider to perform a traditional examination. This may limit your provider's ability to fully assess your condition. If your provider identifies any concerns that need to be evaluated in person or the need to arrange testing (such as labs, EKG, etc.), we will make arrangements to do so. Although advances in technology are sophisticated, we cannot ensure that it will always work on either your end or our end. If the connection with a video visit is poor, the visit may have to be switched to a telephone visit. With either a video or telephone visit, we are not always able to ensure that we have a secure connection.  By engaging in this virtual visit, you consent to the provision of healthcare and authorize for your insurance to be billed (if applicable) for the services provided during this visit. Depending on your insurance coverage, you may receive a charge related to this service.  I need to obtain your verbal consent now. Are you willing to proceed with your visit today? [name] has provided verbal consent on 01/02/2023 for a virtual visit (video or telephone). Micheal Rigg, Santiago  Date: 01/02/2023 12:26 PM  Virtual Visit via Video Note   I, [name], connected with  [name]  ([id number], [birth year]) on 01/02/23 at 12:15 PM EST by a video-enabled telemedicine application and verified that I am speaking with the correct person using two identifiers.  Location: Patient: Virtual Visit Location  Patient: Home Provider: Virtual Visit Location Provider: Home Office   I discussed the limitations of evaluation and management by telemedicine and the availability of in person appointments. The patient expressed understanding and agreed to proceed.    History of Present Illness: [name] is a 57 y.o. who identifies as a male who was assigned male at birth, and is being seen today for possible urinary C. difficile infection.  [name] is requesting a refill of vancomycin due to persistent loose stools which he states has been over the past several weeks (6weeks per patient).  He states he has tried to message infectious disease however I instructed him there are no MyChart messages in epic that have been sent from him to any providers since September 20 and at that time he reported his stools had improved.     Problems:  Patient Active Problem List   Diagnosis Date Noted   C. 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   Localized swelling of right lower extremity 09/26/2019   Hypothyroidism 09/26/2019   Hyperglycemia 09/26/2019   Chronic diarrhea 12/31/2015   Elevated bilirubin 06/19/2015   Elevated alkaline phosphatase level 06/10/2015   Migraine 08/25/2013   Seizures (HCC) 08/25/2013   Herniated nucleus pulposus, C6-7 09/26/2012   Cervical spondylosis with myelopathy 06/20/2012   Numbness and tingling of right leg 05/23/2012   Mild intermittent chronic asthma without complication 12/10/2010   Lower extremity edema 09/01/2010   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 , Disp: , Rfl:    albuterol (ACCUNEB) 1.25 MG/3ML nebulizer  solution, Take 3 mLs (1.25 mg total) by nebulization every 6 (six) hours as needed for wheezing., Disp: 75 mL, Rfl: 3   albuterol (PROVENTIL) (2.5 MG/3ML) 0.083% nebulizer solution, Inhale 3 mLs (2.5 mg total) into the lungs every 6 (six) hours as needed for wheezing or shortness of breath., Disp: 75 mL, Rfl: 0   cephALEXin (KEFLEX) 500 MG capsule, Take 1 capsule (500 mg total) by mouth 4 (four) times daily., Disp: 24 capsule, Rfl: 0   colchicine 0.6 MG tablet, Take 1 tablet (0.6 mg total) by mouth daily. Take 2 pills (1.2 mg) immediately and then another pill 1 hour after if needed, no more than 3 the first day. Then twice daily until 48 hours after resolution., Disp: 11 tablet, Rfl: 0   fluticasone (FLONASE) 50 MCG/ACT nasal spray, Place 2 sprays into both nostrils daily., Disp: 16 g, Rfl: 6   Fluticasone Furoate (ARNUITY ELLIPTA) 100 MCG/ACT AEPB, Inhale 1 puff into the lungs daily as needed., Disp: 30 each, Rfl: 11   levETIRAcetam (KEPPRA) 500 MG tablet, TAKE 1 TABLET EVERY OTHER  DAY, Disp: 45 tablet, Rfl: 0   loperamide (IMODIUM) 2 MG capsule, TAKE 1 CAPSULE DAILY, Disp: 90 capsule, Rfl: 2  Observations/Objective: Patient is well-developed, well-nourished in no acute distress.  Resting comfortably  at home.  Head is normocephalic, atraumatic.  No labored breathing.  Speech is clear and coherent with logical content.  Patient is alert and oriented at baseline.    Assessment and Plan: 1. Clostridium difficile diarrhea - vancomycin (VANCOCIN) 125 MG capsule; Take 1 capsule (125 mg total) by mouth 4 (four) times daily for 10 days.  Dispense: 40 capsule; Refill: 0 Patient and significant other were instructed to reach out to Dr. Renold Don on Monday regarding stools and if appropriate to continue vancomycin.  They verbalized agreement.   Follow Up Instructions: I discussed the assessment and treatment plan with the patient. The patient was provided an opportunity to ask questions and all were  answered. The patient agreed with the plan and demonstrated an understanding of the instructions.  A copy of instructions were sent to the patient via MyChart unless otherwise noted below.    The patient was advised to call back or seek an in-person evaluation if the symptoms worsen or if the condition fails to improve as anticipated.    Micheal Rigg, Santiago

## 2023-01-07 ENCOUNTER — Ambulatory Visit: Payer: Medicare Other | Admitting: Neurology

## 2023-01-07 ENCOUNTER — Encounter: Payer: Self-pay | Admitting: Student

## 2023-01-12 NOTE — Progress Notes (Deleted)
    SUBJECTIVE:   CHIEF COMPLAINT / HPI: Diarrhea  10/19/2022-positive cdiff test at urgent care--fidaxomicin 200 BID x 10 days was sent to pharmacy. Continued to have frequent stools after completing. 11/02/2022-oral vancomycin 125 x 10 days started after [facility] clinic visit. Referred to ID. 11/10/2022-seen by ID-stopped imodium, continued vancomycin thought maybe some IBS picture after cdiff treatment with fluctuating diarrhea/constipation/abdominal spasm) KUB with constipation 12/02/2022-Not taking imodium, getting back to normal-no follow up recommended 01/02/2023-urgent care virtual visit-they requested vancomycin refill which provider ordered? (Vancomycin 125 four times daily for 10 days) and there was no testing performed? 01/07/2023-patient messaged me about persistent diarrhea-states antibiotic not working and taking imodium still  01/08/2023-I recommended patient schedule clinic visit and stop the vancomycin   PERTINENT  PMH / PSH: cerebral palsy, asthma, chronic diarrhea, seizures  OBJECTIVE:   There were no vitals taken for this visit.  ***  ASSESSMENT/PLAN:   No problem-specific Assessment & Plan notes found for this encounter.     Levin Erp, MD [facility]

## 2023-01-13 ENCOUNTER — Ambulatory Visit: Payer: Medicare Other | Admitting: Student

## 2023-01-13 ENCOUNTER — Ambulatory Visit (INDEPENDENT_AMBULATORY_CARE_PROVIDER_SITE_OTHER): Payer: Medicare Other | Admitting: Student

## 2023-01-13 ENCOUNTER — Encounter: Payer: Self-pay | Admitting: Student

## 2023-01-13 VITALS — BP 130/80 | HR 97

## 2023-01-13 DIAGNOSIS — K529 Noninfective gastroenteritis and colitis, unspecified: Secondary | ICD-10-CM

## 2023-01-13 DIAGNOSIS — R197 Diarrhea, unspecified: Secondary | ICD-10-CM

## 2023-01-13 NOTE — Patient Instructions (Addendum)
It was great seeing you today.  As we discussed, -We are collecting C. difficile testing.  We provided you with a collection kit. -Try using over-the-counter Pepto-Bismol to help with your diarrhea. -Please call your infectious disease doctor on Monday and get an appointment with them next week.   If you have any questions or concerns, please feel free to call the clinic.   Have a wonderful day,  Dr. Darral Dash [facility] [phone]

## 2023-01-13 NOTE — Progress Notes (Signed)
    SUBJECTIVE:   CHIEF COMPLAINT / HPI:   [name] is a 57 year old male here with his mother to discuss diarrhea.  He says he stopped taking the antibiotic on 11/16,  Had watery stools today- about 2 episodes, watery. Not necessarily foul-smelling. He was worried it smelled like C. Diff. No fevers, chills.  No vomiting. No abdominal pain or cramping. He has been eating rice, rice pasta, bananas, yogurt, [employer] [place] granola bars.  See history below:  10/19/2022-Urgent Care visit for diarrhea.  Diagnosed with C. difficile, treated with fidaxomicin. 11/02/2022-[facility] visit.  Continued diarrhea.  C. difficile refractory to Pradaxa Meissen, started oral vancomycin 125 mg 4 times daily x 10 days.  Referred to ID. 11/10/2022-ID visit, establish care.  Diarrhea thought to possibly due to C. difficile, but may be other causes as well. 11/18/2022-ID video visit. Constipated, seen on KUB.  Advised to increase fiber in normal activities.  No need for follow-up. 01/02/2023-Virtual family medicine visit with NP.  Patient requested refill of vancomycin due to persistent loose stools.  He was prescribed a 10-day course of vancomycin that he started on 11/16 and discontinued on 11/21 secondary to recommendation by PCP. PERTINENT  PMH / PSH:  OBJECTIVE:   BP 130/80   Pulse 97   SpO2 92%   General: Awake, alert, ambulates via wheelchair, well-dressed and well-nourished  HEENT: Pupils PERRLA, mucous membranes moist, lateral deviation of left eye Cardiac: Regular rate and rhythm Respiratory: Speaks in full sentences.  Normal work of breathing on room air. Abdomen: Soft, nontender and nondistended, bowel sounds normal and active Extremities: Chronic contractures of upper extremities  ASSESSMENT/PLAN:   Diarrhea Afebrile, generally well-appearing with benign abdominal exam. Not convinced that his diarrhea is secondary to C. difficile at this time, given completion of treatment/recommendations  by infectious disease. Also, he is having oscillation of diarrhea and constipation.  Question if underlying IBS.  He has a chronic history of diarrhea for at least 7 years. However, given history of infectious diarrhea, will obtain GI pathogen panel that will test for toxins A and B for C. difficile. Discussed fiber and avoiding fatty foods. Encouraged to use Pepto-Bismol as needed for diarrhea.  If not having improvement, can consider cholestyramine.     Darral Dash, DO [facility]

## 2023-01-18 DIAGNOSIS — R197 Diarrhea, unspecified: Secondary | ICD-10-CM | POA: Insufficient documentation

## 2023-01-18 NOTE — Assessment & Plan Note (Addendum)
Afebrile, generally well-appearing with benign abdominal exam. Not convinced that his diarrhea is secondary to C. difficile at this time, given completion of treatment/recommendations by infectious disease. Also, he is having oscillation of diarrhea and constipation.  Question if underlying IBS.  He has a chronic history of diarrhea for at least 7 years. However, given history of infectious diarrhea, will obtain GI pathogen panel that will test for toxins A and B for C. difficile. Discussed fiber and avoiding fatty foods. Encouraged to use Pepto-Bismol as needed for diarrhea.  If not having improvement, can consider cholestyramine.

## 2023-01-19 LAB — GI PROFILE, STOOL, PCR

## 2023-01-20 ENCOUNTER — Telehealth: Payer: Self-pay

## 2023-01-20 NOTE — Telephone Encounter (Signed)
Patient calls nurse line regarding concerns with positive C-diff test results.   Requesting returned call to discuss further treatment and management.   Please return call to patient at [phone].  Veronda Prude, RN

## 2023-01-21 NOTE — Telephone Encounter (Signed)
Mother returns call to nurse line regarding appointment with ID. She reports that they were able to get him scheduled for 01/27/23.  She wanted to make sure that this was soon enough.Advised that as this is a specialist office this is probably the soonest we would be able to get him seen.   Will forward to Dr. Melissa Noon.   Veronda Prude, RN

## 2023-01-21 NOTE — Telephone Encounter (Signed)
Mother returns call to nurse line.   Mother advised to reach out to ID to schedule an apt as soon as possible. She reports she is unsure if she will be able to get him tomorrow or next week, she reports they typically book out far in advance. Advised to call and to let us know when the soonest available apt is.   Advised to resume Vancomycin.   Mother reports he is dong well and denies any dizziness, nausea or vomiting.  ED precautions discussed.

## 2023-01-21 NOTE — Telephone Encounter (Signed)
Tried to call patient x2 with no answer. Left HIPAA compliant voicemail to call clinic back to let him know of the following:  Recommend he resume his Vancomycin and call infectious disease for an appointment this week, as discussed at our office visit on 11/27. He needs to be re-evaluated by them given recurrence x3 now. If showing signs of dehydration (weak, dizzy) or having intractable nausea, diarrhea, vomiting then he should be seen at the ER.  Darral Dash, DO

## 2023-01-22 ENCOUNTER — Other Ambulatory Visit: Payer: Self-pay | Admitting: Nurse Practitioner

## 2023-01-22 ENCOUNTER — Other Ambulatory Visit: Payer: Self-pay | Admitting: Family Medicine

## 2023-01-22 DIAGNOSIS — A0472 Enterocolitis due to Clostridium difficile, not specified as recurrent: Secondary | ICD-10-CM

## 2023-01-22 DIAGNOSIS — R600 Localized edema: Secondary | ICD-10-CM

## 2023-01-22 NOTE — Telephone Encounter (Signed)
Mother returns call to nurse line regarding vancomycin prescription. She reports that patient threw away prescription when it was discontinued previously.   She is needing a new refill on the vancomycin. Karin Golden has sent an electronic request in separate encounter.   Please advise.   Veronda Prude, RN

## 2023-01-27 ENCOUNTER — Other Ambulatory Visit: Payer: Self-pay

## 2023-01-27 ENCOUNTER — Ambulatory Visit (INDEPENDENT_AMBULATORY_CARE_PROVIDER_SITE_OTHER): Payer: Medicare Other | Admitting: Internal Medicine

## 2023-01-27 ENCOUNTER — Encounter: Payer: Self-pay | Admitting: Internal Medicine

## 2023-01-27 VITALS — BP 146/85 | HR 82 | Temp 97.2°F

## 2023-01-27 DIAGNOSIS — A0472 Enterocolitis due to Clostridium difficile, not specified as recurrent: Secondary | ICD-10-CM | POA: Diagnosis present

## 2023-01-27 NOTE — Patient Instructions (Signed)
We are not sure if this is cdiff yet   We'll do blood test today and stool test which you can submit within the next few days   As long as you feel well it's ok to stop the capsule vancomycin   Keep good hydration for now   See [place] again in 2 weeks but I'll be in contact with you regarding stool testing and next management

## 2023-01-27 NOTE — Progress Notes (Signed)
[facility]  Cc f/u cdiff    Patient Active Problem List   Diagnosis Date Noted   Diarrhea 01/18/2023   C. 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   Localized swelling of right lower extremity 09/26/2019   Hypothyroidism 09/26/2019   Hyperglycemia 09/26/2019   Chronic diarrhea 12/31/2015   Elevated bilirubin 06/19/2015   Elevated alkaline phosphatase level 06/10/2015   Migraine 08/25/2013   Seizures (HCC) 08/25/2013   Herniated nucleus pulposus, C6-7 09/26/2012   Cervical spondylosis with myelopathy 06/20/2012   Numbness and tingling of right leg 05/23/2012   Mild intermittent chronic asthma without complication 12/10/2010   Lower extremity edema 09/01/2010   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      HPI: [name] is a 57 y.o. male hx cerebral palsy, referred by his primary care team from the family medicine teaching service for diarrhea presumed cdiff  He was seen in clinic 11/02/22: I reviewed chart 10/19/22 urgent care visit dx'ed with cdiff. Had 3 day of watery nonbloody diarrhea before this visit. No fever, chill, abd pain, n/v. Given 10 day fidaxomycin. Of note, he was taking immodium prior to cdiff testing 11/02/22 office pcp visit. Continued to have 4 diarrhea a day watery and nonbloody. Taking immodium. Mention wife was also tested positive for cdiff and given po vancomycin. Started on 10 day of PO vancomycin; she was positive 2 weeks before cdiff  It appears at some point he was having up to 10 diarrhea a day before the fidaxomycin was started   9/2 lab showed a cdiff antigen/toxin positive. But didn't deliniate pcr or non-pcr based for toxin testing  ----- Patient is here with his mother today. He is in a wheel chair  He hasn't had a bm for 5 days. Today developing abd pain, nausea. He is still taking immodium 2 pills once a day. 2-4 out of 10 for sx severity  He has a couple days left  with vancomycin.      Appetite is improving  No fever, chill   This is the first time he had cdiff  -------- 01/27/23 id clinic visit Last seen video visit 11/18/22 Last treatment finished late 10/2022 -> solid stool again Diarrhea started mid October 2024  Had pcp visit 11/27 and testing was done 01/18/23 on his stool --> gi pcr negative outside of cdiff   This week diarrhea 2-3 times a day. Sometimes it looks black, some times soft, some times watery. He has tried peptol bismol/kaopeptate. Not trying immodium  No n/v/abd pain/fever-chill  His appetite is good but he worries that if he eats it makes diarrhea worse  He took 1 day of vancomycin PO he has left  Patient sometimes can't control it getting to the bathroom  No sick contact. Wife is ok  Review of Systems: ROS All other ros negative      Past Medical History:  Diagnosis Date   Asthma    albuterol has to rarely usely   Bronchitis    hx of   Cerebral palsy (HCC)    Cervical spondylosis with myelopathy 06/20/2012   Collapse, lung 02/17/2003   left   Gait disorder    Migraine    Migraine    Obesity    Peripheral edema    Pneumonia    hx of   Viral URI with cough 07/01/2017   Vitamin D deficiency     Social History   Tobacco  Use   Smoking status: Never   Smokeless tobacco: Never  Substance Use Topics   Alcohol use: No    Family History  Problem Relation Age of Onset   Cancer - Lung Maternal Uncle    Diabetes Maternal Grandmother    Cancer Maternal Grandfather        Stomach    No Known Allergies  OBJECTIVE: Vitals:   01/27/23 1533  BP: (!) 146/85  Pulse: 82  Temp: (!) 97.2 F (36.2 C)  TempSrc: Temporal  SpO2: 95%   There is no height or weight on file to calculate BMI.   General/constitutional: no distress, pleasant, conversant; chronic cerebral palsy appearance HEENT: Normocephalic, PER, Conj Clear, EOMI, Oropharynx clear Neck supple CV: rrr no mrg Lungs: clear to  auscultation, normal respiratory effort Abd: Soft, Nontender Ext: no edema Skin: No Rash Neuro: nonfocal MSK: no peripheral joint swelling/tenderness/warmth; back spines nontender    Lab: Lab Results  Component Value Date   WBC 9.3 11/10/2022   HGB 15.5 11/10/2022   HCT 46.5 11/10/2022   MCV 92.1 11/10/2022   PLT 259 11/10/2022    Last metabolic panel Lab Results  Component Value Date   GLUCOSE 94 11/10/2022   NA 143 11/10/2022   K 3.4 (L) 11/10/2022   CL 97 (L) 11/10/2022   CO2 34 (H) 11/10/2022   BUN 10 11/10/2022   CREATININE 0.60 (L) 11/10/2022   EGFR 115 11/10/2022   CALCIUM 9.5 11/10/2022   PROT 6.2 11/10/2022   ALBUMIN 4.9 09/18/2019   LABGLOB 1.8 09/18/2019   AGRATIO 2.7 (H) 09/18/2019   BILITOT 2.0 (H) 11/10/2022   ALKPHOS 139 (H) 09/18/2019   AST 30 11/10/2022   ALT 26 11/10/2022   ANIONGAP 7 07/15/2020    Microbiology:  Serology:  Imaging:   Assessment/plan: Problem List Items Addressed This Visit   None       Discuss treatment/evolution of cdiff I have no blood test while he had cdiff  While he could have had cdiff, his diarrhea could be due to other causes. His cdiff testing again unclear if it is non-pcr based.   Today's constipation x5 days seems to be related to immodium rather than severe cdiff. He clinically appears well  We will also do a kub  Mychart visit next    ---------------- 11/18/2022 Getting back to 100%  Last bm solid stool 2 days ago No n/v/fever/chill Not taking immodium Reviewed labs from last visit and kub Kub showed constipation  Advise to resume normal activities and increased fiber in diet and also for the next several days prune juice to help with bowel movement  No need for f/u    01/27/23 id clinic assessment Sx more consistent with IBS or other cause of diarrhea. The recent gi pcr does show positive gene but this by itself in the setting of how he appears/hx is not quite consistent with  active cdiff   Will test for toxin today (not pcr), cbc, cmp Stop po vancomycin Will be in contact with patient to discuss management plan If cdiff testing negative and clinically not worsening will try supportive care such as immodium or lomotil or increased fiber If frank watery diarrhea worsening will try PO vanc and if no improvement will send to gi for chronic diarrhea w/u  F/u 2 weeks     Follow-up: Return in about 2 weeks (around 02/10/2023).  Raymondo Band, MD [facility] 01/27/2023, 3:46 PM

## 2023-01-27 NOTE — Addendum Note (Signed)
Addended by: Harley Alto on: 01/27/2023 04:09 PM   Modules accepted: Orders

## 2023-01-28 ENCOUNTER — Other Ambulatory Visit: Payer: Medicare Other

## 2023-01-28 ENCOUNTER — Other Ambulatory Visit: Payer: Self-pay

## 2023-01-28 DIAGNOSIS — A0472 Enterocolitis due to Clostridium difficile, not specified as recurrent: Secondary | ICD-10-CM

## 2023-01-28 LAB — CBC
HCT: 46 % (ref 38.5–50.0)
Hemoglobin: 15.1 g/dL (ref 13.2–17.1)
MCH: 30.3 pg (ref 27.0–33.0)
MCHC: 32.8 g/dL (ref 32.0–36.0)
MCV: 92.2 fL (ref 80.0–100.0)
MPV: 11.3 fL (ref 7.5–12.5)
Platelets: 293 10*3/uL (ref 140–400)
RBC: 4.99 10*6/uL (ref 4.20–5.80)
RDW: 12.6 % (ref 11.0–15.0)
WBC: 8.6 10*3/uL (ref 3.8–10.8)

## 2023-01-28 LAB — COMPLETE METABOLIC PANEL WITH GFR
AG Ratio: 3.2 (calc) — ABNORMAL HIGH (ref 1.0–2.5)
ALT: 19 U/L (ref 9–46)
AST: 20 U/L (ref 10–35)
Albumin: 4.8 g/dL (ref 3.6–5.1)
Alkaline phosphatase (APISO): 105 U/L (ref 35–144)
BUN/Creatinine Ratio: 14 (calc) (ref 6–22)
BUN: 8 mg/dL (ref 7–25)
CO2: 37 mmol/L — ABNORMAL HIGH (ref 20–32)
Calcium: 9.6 mg/dL (ref 8.6–10.3)
Chloride: 98 mmol/L (ref 98–110)
Creat: 0.59 mg/dL — ABNORMAL LOW (ref 0.70–1.30)
Globulin: 1.5 g/dL — ABNORMAL LOW (ref 1.9–3.7)
Glucose, Bld: 89 mg/dL (ref 65–99)
Potassium: 2.8 mmol/L — ABNORMAL LOW (ref 3.5–5.3)
Sodium: 146 mmol/L (ref 135–146)
Total Bilirubin: 1.7 mg/dL — ABNORMAL HIGH (ref 0.2–1.2)
Total Protein: 6.3 g/dL (ref 6.1–8.1)
eGFR: 115 mL/min/{1.73_m2} (ref >=60)

## 2023-01-29 LAB — C. DIFFICILE GDH AND TOXIN A/B
GDH ANTIGEN: NOT DETECTED
MICRO NUMBER:: 15847540
SPECIMEN QUALITY:: ADEQUATE
TOXIN A AND B: NOT DETECTED

## 2023-02-01 ENCOUNTER — Telehealth: Payer: Self-pay

## 2023-02-01 NOTE — Telephone Encounter (Signed)
-----   Message from [place] sent at 02/01/2023  9:44 AM EST ----- Regarding: FW:  Hi team Please let him know cdiff testing is negative and he can continue just supportive care   No vancomycin needed  As needed inmodium High fiber diet Lower dairy amount  For the next few weeks  Thanks ----- Message ----- From: Janace Hoard Lab Results In Sent: 01/27/2023  10:52 PM EST To: Raymondo Band, MD

## 2023-02-01 NOTE — Telephone Encounter (Signed)
Patient aware. Patient stated that he has improved since visit.   [name] Lesli Albee, CMA

## 2023-02-18 ENCOUNTER — Ambulatory Visit: Payer: Medicare Other | Admitting: Internal Medicine

## 2023-03-01 ENCOUNTER — Ambulatory Visit: Payer: Medicare Other | Admitting: Neurology

## 2023-03-08 ENCOUNTER — Telehealth: Payer: Self-pay

## 2023-03-08 DIAGNOSIS — K529 Noninfective gastroenteritis and colitis, unspecified: Secondary | ICD-10-CM

## 2023-03-08 NOTE — Telephone Encounter (Signed)
Mother calls nurse line requesting a referral to GI.   She reports he was feeling better around Christmas up until the first couple of weeks in January. She reports it was decided to not FU with ID due to improving symptoms. She reports ID reported he did not have c diff.  However, about 1.5 weeks ago his symptoms returned with "yellowish liquid" stools. She reports an episode each time he has a meal. He has been unable to leave the house and reports he has had to wear a "diaper."  She denies any fevers, chills, nausea or vomiting. She does reports abdominal cramping before an episode.   She would like for him to be referred to GI at this point.   Will forward to PCP.

## 2023-03-09 NOTE — Telephone Encounter (Signed)
Called and left a voice mail informing the patient that Dr.Jagadish placed a GI referral and also stated if he start to feel worse to give [place] a call to schedule an appointment.

## 2023-04-20 ENCOUNTER — Encounter: Payer: Self-pay | Admitting: Neurology

## 2023-04-20 ENCOUNTER — Other Ambulatory Visit: Payer: Self-pay | Admitting: Neurology

## 2023-04-20 ENCOUNTER — Telehealth: Payer: Self-pay | Admitting: Neurology

## 2023-04-20 ENCOUNTER — Ambulatory Visit (INDEPENDENT_AMBULATORY_CARE_PROVIDER_SITE_OTHER): Payer: Medicare Other | Admitting: Neurology

## 2023-04-20 VITALS — BP 162/104 | HR 87

## 2023-04-20 DIAGNOSIS — G809 Cerebral palsy, unspecified: Secondary | ICD-10-CM | POA: Diagnosis not present

## 2023-04-20 DIAGNOSIS — R569 Unspecified convulsions: Secondary | ICD-10-CM

## 2023-04-20 MED ORDER — LEVETIRACETAM 500 MG PO TABS: 500.0000 mg | ORAL_TABLET | Freq: Two times a day (BID) | ORAL | 3 refills | Status: DC

## 2023-04-20 NOTE — Telephone Encounter (Signed)
[facility] will be taking this patient.
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
## 2023-04-20 NOTE — Telephone Encounter (Signed)
 no auth required sent to [facility] [phone]

## 2023-04-21 ENCOUNTER — Other Ambulatory Visit: Payer: Self-pay | Admitting: Anesthesiology

## 2023-04-23 ENCOUNTER — Telehealth: Payer: Self-pay

## 2023-04-23 ENCOUNTER — Encounter (HOSPITAL_COMMUNITY): Payer: Self-pay | Admitting: Radiology

## 2023-04-23 ENCOUNTER — Ambulatory Visit (HOSPITAL_COMMUNITY)
Admission: RE | Admit: 2023-04-23 | Discharge: 2023-04-23 | Disposition: A | Discharge status: Home / Self Care | Source: Ambulatory Visit | Attending: Neurology | Admitting: Neurology

## 2023-04-23 DIAGNOSIS — R569 Unspecified convulsions: Secondary | ICD-10-CM | POA: Diagnosis present

## 2023-04-23 DIAGNOSIS — G809 Cerebral palsy, unspecified: Secondary | ICD-10-CM | POA: Insufficient documentation

## 2023-04-23 MED ORDER — GADOBUTROL 1 MMOL/ML IV SOLN
9.0000 mL | Freq: Once | INTRAVENOUS | Status: AC | PRN
  Administered 2023-04-23: 9 mL via INTRAVENOUS

## 2023-04-23 NOTE — Telephone Encounter (Signed)
 [employer] LVM on nurse line in regards to PT/OT orders.   She reports they have faxed over multiple documents for PCP to sign for patient to receive services.   Will forward to PCP.

## 2023-04-27 NOTE — Progress Notes (Unsigned)
 Chief Complaint: IBS-D Primary GI MD: Gentry Fitz  HPI: 58 year old male with history of c diff colitis, asthma, cerebral palsy, and others as listed below presents for evaluation of IBS-D  Recently seen by infectious disease 01/2023 Diagnosed with C. difficile 10/19/2022 at urgent care after having 3 days of watery nonbloody diarrhea.  (C. difficile antigen/toxin positive) given 10-day fidaxomicin.  Wife also tested positive for C. difficile.  Patient had resolution after fidaxomicin but then had diarrhea restart October 2024 and testing 01/18/2023 was PCR negative for C. Difficile.  ID retested and was negative for GDH antigen, toxin AMB.  Patient was thought to have IBS-D and referred to GI  Discussed the use of AI scribe software for clinical note transcription with the patient, who gave verbal consent to proceed.  History of Present Illness             PREVIOUS GI WORKUP     Past Medical History:  Diagnosis Date   Asthma    albuterol has to rarely usely   Bronchitis    hx of   Cerebral palsy (HCC)    Cervical spondylosis with myelopathy 06/20/2012   Collapse, lung 02/17/2003   left   Gait disorder    Migraine    Migraine    Obesity    Peripheral edema    Pneumonia    hx of   Viral URI with cough 07/01/2017   Vitamin D deficiency     Past Surgical History:  Procedure Laterality Date   ANTERIOR CERVICAL DECOMP/DISCECTOMY FUSION N/A 09/26/2012   Procedure: Cervical six-seven Anterior cervical decompression/diskectomy/fusion;  Surgeon: Barnett Abu, MD;  Location: [facility];  Service: Neurosurgery;  Laterality: N/A;   CERVICAL FUSION     CHOLECYSTECTOMY     INTERTROCHANTERIC HIP FRACTURE SURGERY Right    LAMINECTOMY LUMBAR SPLINE W/ PLACEMENT SPINAL CORD STIMULATOR     LUNG SURGERY  02/16/2002   TENDON TRANSFER     Multiple, both legs    Current Outpatient Medications  Medication Sig Dispense Refill   acetaminophen (TYLENOL ARTHRITIS PAIN) 650 MG CR  tablet Take 1,300 mg by mouth daily as needed.      albuterol (ACCUNEB) 1.25 MG/3ML nebulizer solution Take 3 mLs (1.25 mg total) by nebulization every 6 (six) hours as needed for wheezing. 75 mL 3   albuterol (PROVENTIL) (2.5 MG/3ML) 0.083% nebulizer solution Inhale 3 mLs (2.5 mg total) into the lungs every 6 (six) hours as needed for wheezing or shortness of breath. 75 mL 0   cephALEXin (KEFLEX) 500 MG capsule Take 1 capsule (500 mg total) by mouth 4 (four) times daily. (Patient not taking: Reported on 04/20/2023) 24 capsule 0   colchicine 0.6 MG tablet Take 1 tablet (0.6 mg total) by mouth daily. Take 2 pills (1.2 mg) immediately and then another pill 1 hour after if needed, no more than 3 the first day. Then twice daily until 48 hours after resolution. (Patient not taking: Reported on 01/27/2023) 11 tablet 0   fluticasone (FLONASE) 50 MCG/ACT nasal spray Place 2 sprays into both nostrils daily. 16 g 6   Fluticasone Furoate (ARNUITY ELLIPTA) 100 MCG/ACT AEPB Inhale 1 puff into the lungs daily as needed. 30 each 11   levETIRAcetam (KEPPRA) 500 MG tablet Take 1 tablet (500 mg total) by mouth 2 (two) times daily. 60 tablet 3   levETIRAcetam (KEPPRA) 500 MG tablet Take 1 tablet (500 mg total) by mouth 2 (two) times daily. Correct sig  take 1  tablet 500 mg by mouth 2 times daily. (1000 mg a day) 180 tablet 3   loperamide (IMODIUM) 2 MG capsule TAKE 1 CAPSULE DAILY (Patient not taking: Reported on 04/20/2023) 90 capsule 2   No current facility-administered medications for this visit.    Allergies as of 04/28/2023   (No Known Allergies)    Family History  Problem Relation Age of Onset   Cancer - Lung Maternal Uncle    Diabetes Maternal Grandmother    Cancer Maternal Grandfather        Stomach    Social History   Socioeconomic History   Marital status: Single    Spouse name: Not on file   Number of children: 0   Years of education: 2-College   Highest education level: Not on file   Occupational History    Employer: DISABLED  Tobacco Use   Smoking status: Never   Smokeless tobacco: Never  Substance and Sexual Activity   Alcohol use: No   Drug use: Not on file   Sexual activity: Not on file  Other Topics Concern   Not on file  Social History Narrative   Not on file   Social Drivers of Health   Financial Resource Strain: Low Risk  (09/30/2022)   Overall Financial Resource Strain (CARDIA)    Difficulty of Paying Living Expenses: Not hard at all  Food Insecurity: No Food Insecurity (09/30/2022)   Hunger Vital Sign    Worried About Running Out of Food in the Last Year: Never true    Ran Out of Food in the Last Year: Never true  Transportation Needs: No Transportation Needs (09/30/2022)   PRAPARE - Administrator, Civil Service (Medical): No    Lack of Transportation (Non-Medical): No  Physical Activity: Inactive (09/30/2022)   Exercise Vital Sign    Days of Exercise per Week: 0 days    Minutes of Exercise per Session: 0 min  Stress: No Stress Concern Present (09/30/2022)   [employer] of Occupational Health - Occupational Stress Questionnaire    Feeling of Stress : Not at all  Social Connections: Moderately Isolated (09/30/2022)   Social Connection and Isolation Panel [NHANES]    Frequency of Communication with Friends and Family: More than three times a week    Frequency of Social Gatherings with Friends and Family: Three times a week    Attends Religious Services: 1 to 4 times per year    Active Member of Clubs or Organizations: No    Attends Banker Meetings: Never    Marital Status: Never married  Intimate Partner Violence: Not At Risk (09/30/2022)   Humiliation, Afraid, Rape, and Kick questionnaire    Fear of Current or Ex-Partner: No    Emotionally Abused: No    Physically Abused: No    Sexually Abused: No    Review of Systems:    Constitutional: No weight loss, fever, chills, weakness or fatigue HEENT: Eyes: No  change in vision               Ears, Nose, Throat:  No change in hearing or congestion Skin: No rash or itching Cardiovascular: No chest pain, chest pressure or palpitations   Respiratory: No SOB or cough Gastrointestinal: See HPI and otherwise negative Genitourinary: No dysuria or change in urinary frequency Neurological: No headache, dizziness or syncope Musculoskeletal: No new muscle or joint pain Hematologic: No bleeding or bruising Psychiatric: No history of depression or anxiety    Physical Exam:  Vital signs: There were no vitals taken for this visit.  Constitutional: NAD, Well developed, Well nourished, alert and cooperative Head:  Normocephalic and atraumatic. Eyes:   PEERL, EOMI. No icterus. Conjunctiva pink. Respiratory: Respirations even and unlabored. Lungs clear to auscultation bilaterally.   No wheezes, crackles, or rhonchi.  Cardiovascular:  Regular rate and rhythm. No peripheral edema, cyanosis or pallor.  Gastrointestinal:  Soft, nondistended, nontender. No rebound or guarding. Normal bowel sounds. No appreciable masses or hepatomegaly. Rectal:  Not performed.  Msk:  Symmetrical without gross deformities. Without edema, no deformity or joint abnormality.  Neurologic:  Alert and  oriented x4;  grossly normal neurologically.  Skin:   Dry and intact without significant lesions or rashes. Psychiatric: Oriented to person, place and time. Demonstrates good judgement and reason without abnormal affect or behaviors.  Physical Exam           RELEVANT LABS AND IMAGING: CBC    Component Value Date/Time   WBC 8.6 01/27/2023 1609   RBC 4.99 01/27/2023 1609   HGB 15.1 01/27/2023 1609   HCT 46.0 01/27/2023 1609   PLT 293 01/27/2023 1609   MCV 92.2 01/27/2023 1609   MCH 30.3 01/27/2023 1609   MCHC 32.8 01/27/2023 1609   RDW 12.6 01/27/2023 1609   LYMPHSABS 614 (L) 11/10/2022 0224   MONOABS 0.5 07/15/2020 1007   EOSABS 28 11/10/2022 0224   BASOSABS 37 11/10/2022  0224    CMP     Component Value Date/Time   NA 146 01/27/2023 1609   NA 143 07/10/2022 1527   K 2.8 (L) 01/27/2023 1609   CL 98 01/27/2023 1609   CO2 37 (H) 01/27/2023 1609   GLUCOSE 89 01/27/2023 1609   BUN 8 01/27/2023 1609   BUN 12 07/10/2022 1527   CREATININE 0.59 (L) 01/27/2023 1609   CALCIUM 9.6 01/27/2023 1609   PROT 6.3 01/27/2023 1609   PROT 6.7 09/18/2019 1727   ALBUMIN 4.9 09/18/2019 1727   AST 20 01/27/2023 1609   ALT 19 01/27/2023 1609   ALKPHOS 139 (H) 09/18/2019 1727   BILITOT 1.7 (H) 01/27/2023 1609   BILITOT 1.5 (H) 09/18/2019 1727   GFRNONAA >60 07/15/2020 1007   GFRNONAA >89 05/28/2015 0939   GFRAA 128 09/18/2019 1727   GFRAA >89 05/28/2015 0939     Assessment/Plan:   Assessment and Plan                [name]  Gastroenterology 04/27/2023, 4:51 PM  Cc: Doreene Eland, MD

## 2023-04-28 ENCOUNTER — Ambulatory Visit (INDEPENDENT_AMBULATORY_CARE_PROVIDER_SITE_OTHER): Admitting: Neurology

## 2023-04-28 ENCOUNTER — Ambulatory Visit: Payer: Medicare Other | Admitting: Gastroenterology

## 2023-04-28 ENCOUNTER — Encounter: Payer: Self-pay | Admitting: Gastroenterology

## 2023-04-28 VITALS — BP 132/80

## 2023-04-28 DIAGNOSIS — G809 Cerebral palsy, unspecified: Secondary | ICD-10-CM

## 2023-04-28 DIAGNOSIS — Z8619 Personal history of other infectious and parasitic diseases: Secondary | ICD-10-CM

## 2023-04-28 DIAGNOSIS — Z1211 Encounter for screening for malignant neoplasm of colon: Secondary | ICD-10-CM

## 2023-04-28 DIAGNOSIS — Z09 Encounter for follow-up examination after completed treatment for conditions other than malignant neoplasm: Secondary | ICD-10-CM

## 2023-04-28 DIAGNOSIS — R569 Unspecified convulsions: Secondary | ICD-10-CM

## 2023-04-28 DIAGNOSIS — A0472 Enterocolitis due to Clostridium difficile, not specified as recurrent: Secondary | ICD-10-CM

## 2023-04-28 DIAGNOSIS — Z9049 Acquired absence of other specified parts of digestive tract: Secondary | ICD-10-CM

## 2023-04-28 NOTE — Patient Instructions (Signed)
 Follow up as needed  If your blood pressure at your visit was 140/90 or greater, please contact your primary care physician to follow up on this.  _______________________________________________________  If you are age 56 or older, your body mass index should be between 23-30. Your There is no height or weight on file to calculate BMI. If this is out of the aforementioned range listed, please consider follow up with your Primary Care Provider.  If you are age 60 or younger, your body mass index should be between 19-25. Your There is no height or weight on file to calculate BMI. If this is out of the aformentioned range listed, please consider follow up with your Primary Care Provider.   ________________________________________________________  The [facility] providers would like to encourage you to use [facility] to communicate with providers for non-urgent requests or questions.  Due to long hold times on the telephone, sending your provider a message by [facility] may be a faster and more efficient way to get a response.  Please allow 48 business hours for a response.  Please remember that this is for non-urgent requests.   It was a pleasure to see you today!  Thank you for trusting me with your gastrointestinal care!

## 2023-05-10 NOTE — Telephone Encounter (Signed)
 Mother returns call to nurse line regarding form completion.   These forms were faxed, however, physician signature was by a resident.   Form needs to be signed by an attending. Called [employer] [facility] [employer] to determine if attending could sign beneath resident. Representative states that form will need to be completed again with attending signature.   They are faxing over a new copy. Will place existing copy in PCP box, if needed to complete new form.   Please have attending sign this paper. Thanks.   Veronda Prude, RN

## 2023-05-10 NOTE — Procedures (Signed)
   HISTORY: History of hypoxic brain injury, episodes suspicious for partial seizure  TECHNIQUE:  This is a routine 16 channel EEG recording with one channel devoted to a limited EKG recording.  It was performed during wakefulness, drowsiness and asleep.  Photic stimulation were performed as activating procedures.  There are minimum muscle and movement artifact noted.  Upon maximum arousal, posterior dominant waking rhythm consistent of rhythmic alpha range activity. Activities are symmetric over the bilateral posterior derivations and attenuated with eye opening.  Photic stimulation did not alter the tracing.   During EEG recording, patient developed drowsiness and no deeper stage of sleep was achieved.  During EEG recording, there was no epileptiform discharge noted.  EKG demonstrate normal sinus rhythm.  CONCLUSION: This is a  normal awake EEG.  There is no electrodiagnostic evidence of epileptiform discharge.  Levert Feinstein, M.D. Ph.D.  [facility] [address] [place], [place] [address] Phone: [phone] Fax:      [phone]

## 2023-05-12 ENCOUNTER — Encounter: Payer: Self-pay | Admitting: Neurology

## 2023-05-13 ENCOUNTER — Encounter: Payer: Self-pay | Admitting: Neurology

## 2023-05-13 NOTE — Telephone Encounter (Signed)
Form signed and placed in fax pile. 

## 2023-05-17 NOTE — Progress Notes (Signed)
 Addendum: Reviewed and agree with assessment and management plan. Asha Grumbine, Carie Caddy, MD

## 2023-05-20 ENCOUNTER — Other Ambulatory Visit: Payer: Self-pay

## 2023-05-31 ENCOUNTER — Other Ambulatory Visit: Payer: Self-pay

## 2023-05-31 DIAGNOSIS — J4520 Mild intermittent asthma, uncomplicated: Secondary | ICD-10-CM

## 2023-05-31 MED ORDER — ARNUITY ELLIPTA 100 MCG/ACT IN AEPB: 1.0000 | INHALATION_SPRAY | Freq: Every day | RESPIRATORY_TRACT | 11 refills | Status: DC | PRN

## 2023-06-08 ENCOUNTER — Ambulatory Visit: Attending: *Deleted | Admitting: Physical Therapy

## 2023-06-08 DIAGNOSIS — Z993 Dependence on wheelchair: Secondary | ICD-10-CM | POA: Diagnosis not present

## 2023-06-08 DIAGNOSIS — R278 Other lack of coordination: Secondary | ICD-10-CM | POA: Insufficient documentation

## 2023-06-08 DIAGNOSIS — R2681 Unsteadiness on feet: Secondary | ICD-10-CM | POA: Diagnosis present

## 2023-06-08 DIAGNOSIS — R2689 Other abnormalities of gait and mobility: Secondary | ICD-10-CM | POA: Insufficient documentation

## 2023-06-08 DIAGNOSIS — M6281 Muscle weakness (generalized): Secondary | ICD-10-CM | POA: Insufficient documentation

## 2023-06-08 NOTE — Therapy (Signed)
 OUTPATIENT PHYSICAL THERAPY WHEELCHAIR EVALUATION   Patient Name: [name] MRN: [id number] DOB:09-02-1965, 58 y.o., male Today's Date: 06/08/2023  END OF SESSION:  PT End of Session - 06/08/23 1312     Visit Number 1    Number of Visits 1    Date for PT Re-Evaluation 06/08/23    Authorization Type Medicare    PT Start Time 1310    PT Stop Time 1407    PT Time Calculation (min) 57 min    Activity Tolerance Patient tolerated treatment well    Behavior During Therapy [facility] for tasks assessed/performed             Past Medical History:  Diagnosis Date   Asthma    albuterol  has to rarely usely   Bronchitis    hx of   Cerebral palsy (HCC)    Cervical spondylosis with myelopathy 06/20/2012   Collapse, lung 02/17/2003   left   Gait disorder    Migraine    Migraine    Obesity    Peripheral edema    Pneumonia    hx of   Viral URI with cough 07/01/2017   Vitamin D  deficiency    Past Surgical History:  Procedure Laterality Date   ANTERIOR CERVICAL DECOMP/DISCECTOMY FUSION N/A 09/26/2012   Procedure: Cervical six-seven Anterior cervical decompression/diskectomy/fusion;  Surgeon: Elna Haggis, MD;  Location: [facility];  Service: Neurosurgery;  Laterality: N/A;   CERVICAL FUSION     CHOLECYSTECTOMY     INTERTROCHANTERIC HIP FRACTURE SURGERY Right    LAMINECTOMY LUMBAR SPLINE W/ PLACEMENT SPINAL CORD STIMULATOR     LUNG SURGERY  02/16/2002   TENDON TRANSFER     Multiple, both legs   Patient Active Problem List   Diagnosis Date Noted   Diarrhea 01/18/2023   C. 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   Localized swelling of right lower extremity 09/26/2019   Hypothyroidism 09/26/2019   Hyperglycemia 09/26/2019   Chronic diarrhea 12/31/2015   Elevated bilirubin 06/19/2015   Elevated alkaline phosphatase level 06/10/2015   Migraine 08/25/2013   Seizures (HCC) 08/25/2013   Herniated nucleus pulposus, C6-7 09/26/2012   Cervical  spondylosis with myelopathy 06/20/2012   Numbness and tingling of right leg 05/23/2012   Mild intermittent chronic asthma without complication 12/10/2010   Lower extremity edema 09/01/2010   Cerebral palsy (HCC) 04/15/2006    PCP: Genora Kidd, MD  REFERRING PROVIDER: Genora Kidd, MD  THERAPY DIAG:  Muscle weakness (generalized)  Other abnormalities of gait and mobility  Unsteadiness on feet  Other lack of coordination  Rationale for Evaluation and Treatment Habilitation  SUBJECTIVE:  SUBJECTIVE STATEMENT: Pt presents for a new power wheelchair evaluation. Pt received his current power wheelchair in June of 2020. Pt reports no falls in the past 6 months and no near falls, utilizes grab bars around his home to assist him with transfers. Pt reports that if he did have a fall he would not be able to get back up himself.  Patient requests: *** Swing away leg rests  Body point belt, 2 inch padded belt, airplane clasp  USB charger/plug  Bluetooth on phone connects to [facility] controller  Seat elevator  Medicare and Medicaid***  Does NOT want swing away joystick, on R side  High speed motors         PRECAUTIONS: Fall   RED FLAGS:None    WEIGHT BEARING RESTRICTIONS No    OCCUPATION: on disability  PLOF:  Independent with transfers, Requires assistive device for independence, Needs assistance with ADLs, and Needs assistance with homemaking  PATIENT GOALS: to obtain power wheeled mobility to allow for safe and independent pressure relief, safe and independent mobility, and ability to complete MRADLs in a safe and timely manner           MEDICAL HISTORY:  Primary diagnosis onset: 04/15/2006 (per chart)     Medical Diagnosis with ICD-10 code: Cerebral palsy G80.9    [] Progressive disease  Relevant future surgeries: N/A    Height: 5'9" Weight: 210 lbs Explain recent changes or trends in weight:  N/A    History:  Past Medical History:  Diagnosis Date   Asthma    albuterol  has to rarely usely   Bronchitis    hx of   Cerebral palsy (HCC)    Cervical spondylosis with myelopathy 06/20/2012   Collapse, lung 02/17/2003   left   Gait disorder    Migraine    Migraine    Obesity    Peripheral edema    Pneumonia    hx of   Viral URI with cough 07/01/2017   Vitamin D  deficiency        Cardio Status:  Functional Limitations: impaired due to being wheelchair-dependent  [] Intact  [x]  Impaired      Respiratory Status:  Functional Limitations: impaired due to being wheelchair-dependent  [] Intact  [x] Impaired   [] SOB [] COPD [] O2 Dependent ______LPM  [] Ventilator Dependent  Resp equip:                                                     Objective Measure(s):   Orthotics:   [] Amputee:                                                             [] Prosthesis:        HOME ENVIRONMENT:  [x] House [] Condo/town home [] Apartment [] Asst living [] LTCF         [] Own  [x] Rent   [] Lives alone [x] Lives with others -    partner                         Hours without assistance: 12-16 hours/day  [x] Home is accessible to patient  Storage of wheelchair:  [x] In home   [] Other Comments:        COMMUNITY :  TRANSPORTATION:  [] Car [] Ship broker [] Adapted w/c Lift []  Ambulance [] Other:                     [x] Sits in wheelchair during transport   Where is w/c stored during transport?  [x] [employer]  []  [employer]  r   [] Self-Driver       Drive while in  Biomedical scientist [] yes [x] no   Employment and/or school: N/A Specific requirements pertaining to mobility        Other:  COMMUNICATION:  Verbal Communication  [x] WFL [] receptive [x] WFL [] expressive [x] Understandable  [] Difficult to understand  [] non-communicative  Primary  Language:____English__________ 2nd:_____________  Communication provided by:[x] Patient [] Family [] Caregiver [] Translator   [] Uses an Paramedic device     Manufacturer/Model :                                                                MOBILITY/BALANCE:  Sitting Balance  Standing Balance  Transfers  Ambulation   [x] WFL      [] WFL  [x] Independent  []  Independent   [] Uses UE for balance in sitting Comments:  [x] Uses UE/device for stability Comments: uses grab bars []  Min assist  []  Ambulates independently with       device:___________________      []  Mod assist  []  Able to ambulate ______ feet        safely/functionally/independently   []  Min assist  []  Min assist  []  Max assist  []  Non-functional ambulator         History/High risk of falls   []  Mod assist  []  Mod assist  []  Dependent  [x]  Unable to ambulate   []  Max  assist  []  Max assist  Transfer method:[] 1 person [] 2 person [] sliding board [] squat pivot [] stand pivot [] mechanical patient lift  [] other:   []  Unable  []  Unable    Fall History: # of falls in the past 6 months? 0 # of "near" falls in the past 6 months? 0    CURRENT SEATING / MOBILITY:  Current Mobility Device: [] None [] Cane/Walker [] Manual [] Dependent [] Dependent w/ Tilt rScooter  [x] Power (type of control):   Manufacturer: Information systems manager: Rival Serial #:   Size:  Color:  Age: June 2020  Purchased by whom: patient/insurance  Current condition of mobility base:  shows age related wear and tear  Current seating system:                            Quantum Rival power wheelchair                                           Age of seating system:  June 2020  Describe posture in present seating system: Pt exhibits thoracic kyphosis and posterior pelvic tilt with forwards head while seated in his current power wheelchair.   Is the current mobility meeting medical necessity?:  [] Yes [x] No Describe: Patient's current power wheelchair is almost 56 years old and is  showing age-related wear and tear. The power elevating  leg rests on his chair stopped worked so he removed the center mount footrests for transfers and no longer has foot support when sitting in his chair. Additionally, he cannot tilt his power wheelchair all the way back because his chair will get stuck in that position and he relies on a 2nd person to push him back up into sitting.                                       Ability to complete Mobility-Related Activities of Daily Living (MRADL's) with Current Mobility Device:   Move room to room  [x] Independent  [] Min [] Mod [] Max assist  [] Unable  Comments:   Meal prep  [] Independent  [x] Min [] Mod [] Max assist  [] Unable    Feeding  [x] Independent  [] Min [] Mod [] Max assist  [] Unable    Bathing  [] Independent  [x] Min [] Mod [] Max assist  [] Unable    Grooming  [] Independent  [x] Min [] Mod [] Max assist  [] Unable    UE dressing  [] Independent  [x] Min [] Mod [] Max assist  [] Unable    LE dressing  [] Independent   [x] Min [] Mod [] Max assist  [] Unable    Toileting  [x] Independent  [] Min [] Mod [] Max assist  [] Unable    Bowel Mgt: []  Continent []  Incontinent [x]  Accidents []  Diapers []  Colostomy []  Bowel Program:  Bladder Mgt: []  Continent []  Incontinent [x]  Accidents []  Diapers []  Urinal []  Intermittent Cath []  Indwelling Cath []  Supra-pubic Cath     Current Mobility Equipment Trialed/ Ruled Out:    Does not meet mobility needs due to:    Mark all boxes that indicate inability to use the specific equipment listed     Meets needs for safe  independent functional  ambulation  / mobility    Risk of  Falling or History of Falls    Enviromental limitations      Cognition    Safety concerns with  physical ability    Decreased / limitations endurance  & strength     Decreased / limitations  motor skills  & coordination    Pain    Pace /  Speed    Cardiac and/or  respiratory condition    Contra - indicated by diagnosis   Cane/Crutches  []   [x]    []   []   [x]   [x]   [x]   []   [x]   []   [x]    Walker / Rollator  []  NA   []   [x]   []   []   [x]   [x]   [x]   []   [x]   []   []     Manual Wheelchair [id number]:  []  NA  []   []   []   []   [x]   [x]   [x]   []   [x]   []   []    Manual W/C (K0005) with power assist  []  NA  []   []   []   []   [x]   [x]   [x]   []   [x]   []   []    Scooter  []  NA  []   [x]   []   []   [x]   [x]   [x]   []   []   []   [x]    Power Wheelchair: standard joystick  []  NA  [x]   []   []   []   []   []   []   []   []   []   []    Power Wheelchair: alternative controls  []  NA  []   []   []   []   []   []   []   []   []   []   []   Summary:  The least costly alternative for independent functional mobility was found to be:    []  Crutch/Cane  []  Walker []  Manual w/c  []  Manual w/c with power assist   []  Scooter   [x]  Power w/c std joystick   []  Power w/c alternative control        []  Requires dependent care mobility device   Cabin crew for [employer] skills are adequate for safe mobility equipment operation  [x]   Yes []   No  Patient is willing and motivated to use recommended mobility equipment  [x]   Yes []   No       []  Patient is unable to safely operate mobility equipment independently and requires dependent care equipment Comments:           SENSATION and SKIN ISSUES:  Sensation [x]  Intact  []  Impaired []  Absent []  Hyposensate []  Hypersensate  []  Defensiveness  Location(s) of impairment: N/A   Pressure Relief Method(s):  []  Lean side to side to offload (without risk of falling)  []   W/C push up (4+ times/hour for 15+ seconds) []  Stand up (without risk of falling)    [x]  Other: (Describe): patient currently performs pressure relief via power tilt and recline features on his current power wheelchair, however his current chair gets stuck in a tilted position and requires manual assist from a 2nd person to return to sitting upright Effective pressure relief method(s) above can be performed consistently throughout the day: [x] Yes  []   No If not, Why?:  Skin Integrity Risk:       [x]  Low risk           []  Moderate risk            []  High risk  If high risk, explain:   Skin Issues/Skin Integrity  Current skin Issues  []  Yes [x]  No []  Intact  []   Red area   []   Open area  []  Scar tissue  [x]  At risk from prolonged sitting  Where: sacrum History of Skin Issues  []  Yes [x]  No Where : When: Stage: Hx of skin flap surgeries  []  Yes [x]  No Where:  When:  Pain: [x]  Yes []  No   Pain Location(s): neck from surgical intervention Intensity scale: (0-10) : 5 How does pain interfere with mobility and/or MRADLs? - patient cannot turn his head side to side        MAT EVALUATION:  Neuro-Muscular Status: (Tone, Reflexive, Responses, etc.)     []  Intact   [x]  Spasticity: *** []  Hypotonicity  []  Fluctuating  []  Muscle Spasms  []  Poor Righting Reactions/Poor Equilibrium Reactions  []  Primal Reflex(s):    Comments:            COMMENTS:    POSTURE:     Comments:  Pelvis Anterior/Posterior:  []  Neutral   [x]  Posterior  []  Anterior  []  Fixed - No movement [x]  Tendency away from neutral []  Flexible []  Self-correction []  External correction Obliquity (viewed from front)  [x]  WFL []  R Obliquity []  L Obliquity  []  Fixed - No movement []  Tendency away from neutral []  Flexible []  Self-correction []  External correction Rotation  [x]  WFL []  R anterior []  L anterior  []  Fixed - No movement []  Tendency away from neutral []  Flexible []  Self-correction []  External correction Tonal Influence Pelvis:  [x]  Normal []  Flaccid []  Low tone []  Spasticity []  Dystonia []  Pelvis thrust []  Other:    Trunk Anterior/Posterior:  []   [facility] [x]  Thoracic kyphosis []  Lumbar lordosis  []  Fixed - No movement [x]  Tendency away from neutral []  Flexible []  Self-correction []  External correction  [x]  WFL []  Convex to left  []  Convex to right []  S-curve   []  C-curve []  Multiple curves []  Tendency away from  neutral []  Flexible []  Self-correction []  External correction Rotation of shoulders and upper trunk:  [x]  Neutral []  Left-anterior []  Right- anterior []  Fixed- no movement []  Tendency away from neutral []  Flexible []  Self correction []  External correction Tonal influence Trunk:  [x]  Normal []  Flaccid []  Low tone []  Spasticity []  Dystonia []  Other:   Head & Neck  []  Functional []  Flexed    []  Extended []  Rotated right  []  Rotated left []  Laterally flexed right []  Laterally flexed left []  Cervical hyperextension [x]  Limited mobility due to ACDF in 2014   []  Good head control []  Adequate head control [x]  Limited head control []  Absent head control Describe tone/movement of head and neck: ACDF in 2014 limiting cervical rotation     Lower Extremity Measurements: LE ROM:  Active ROM Right 06/08/2023 Left 06/08/2023  Hip flexion [facility]  Hip extension    Hip abduction    Hip adduction    Knee flexion [facility] WFL  Knee extension [facility] WFL  Ankle dorsiflexion    Ankle plantarflexion     (Blank rows = not tested)  LE MMT:  MMT Right 06/08/2023 Left 06/08/2023  Hip flexion 2- 3  Hip extension    Hip abduction    Hip adduction    Knee flexion 3 3  Knee extension 3 3  Ankle dorsiflexion 3 3  Ankle plantarflexion     (Blank rows = not tested)  Hip positions:  [x]  Neutral   []  Abducted   []  Adducted  []  Subluxed   []  Dislocated   []  Fixed   []  Tendency away from neutral []  Flexible []  Self-correction []  External correction   Hip Windswept:[x]  Neutral  []  Right    []  Left  []  Subluxed   []  Dislocated   []  Fixed   []  Tendency away from neutral []  Flexible []  Self-correction []  External correction  LE Tone: []  Normal []  Low tone [x]  Spasticity []  Flaccid []  Dystonia []  Rocks/Extends at hip []  Thrust into knee extension []  Pushes legs downward into footrest  Foot positioning: ROM Concerns: Dorsiflexed: []  Right   []  Left Plantar flexed: [x]  Right     [x]  Left Inversion: []  Right    []  Left Eversion: []  Right    []  Left  LE Edema: []  1+ (Barely detectable impression when finger is pressed into skin) []  2+ (slight indentation. 15 seconds to rebound) []  3+ (deeper indentation. 30 seconds to rebound) []  4+ (>30 seconds to rebound)  UE Measurements:  UPPER EXTREMITY ROM:   Active ROM Right 06/08/2023 Left 06/08/2023  Shoulder flexion limited limited  Shoulder abduction limited limited  Shoulder adduction    Elbow flexion    Elbow extension    Wrist flexion    Wrist extension    (Blank rows = not tested)  UPPER EXTREMITY MMT:  MMT Right 06/08/2023 Left 06/08/2023  Shoulder flexion 2- 2-  Shoulder abduction 2- 2-  Shoulder adduction    Elbow flexion    Elbow extension    Wrist flexion Flexion contracture Flexion contracture  Wrist extension    Pinch strength    Grip strength    (Blank rows = not tested)  Shoulder Posture:  Right Tendency towards Left  []   Functional []    []   Elevation []    [x]   Depression [x]    [x]   Protraction [x]    []   Retraction []    []   Internal rotation []    []   External rotation []    []   Subluxed []     UE Tone: []  Normal []  Flaccid []  Low tone [x]  Spasticity  []  Dystonia []  Other:   UE Edema: []  1+ (Barely detectable impression when finger is pressed into skin) []  2+ (slight indentation. 15 seconds to rebound) []  3+ (deeper indentation. 30 seconds to rebound) []  4+ (>30 seconds to rebound)  Wrist/Hand: Handedness: [x]  Right   []  Left   []  NA: Comments:  Right  Left  []   WNL []    []   Limitations []    []   Contractures [x]    []   Fisting []    []   Tremors []    [x]   Weak grasp [x]    []   Poor dexterity [x]    []   Hand movement non functional []    []   Paralysis []         MOBILITY BASE RECOMMENDATIONS and JUSTIFICATION:  MOBILITY BASE  JUSTIFICATION   Manufacturer:   Quantum Model:              R-Trak                Color:  Seat Width:  18" Seat Depth:  20"   []  Manual  mobility base (continue below)   []  Scooter/POV  [x]  Power mobility base   Number of hours per day spent in above selected mobility base: 16 hours/day   Typical daily mobility base use Schedule: Patient will utilize his power wheelchair to perform all of his functional mobility in the home. It will allow him to travel between rooms of his home safely and efficiently in order to complete all of his MRADLs. Pt is able to perform stand pivot transfers mod I with use of grab bars around his home. Due to impaired UE strength and coordination as well as UE and LE spasticity he is not able to propel himself safely or efficiently in a manual wheelchair or perform safe and effective pressure relief. He requires use of a power wheelchair for independent mobility in his home, ability to complete his MRADL's, and ability to perform safe and effective pressure relief throughout the day.    [x]  is not a safe, functional ambulator  []  limitation prevents from completing a MRADL(s) within a reasonable time frame    []  limitation places at high risk of morbidity or mortality secondary to  the attempts to perform a    MRADL(s)  [x]  limitation prevents accomplishing a MRADL(s) entirely  [x]  provide independent mobility  [x]  equipment is a lifetime medical need  [x]  walker or cane inadequate  [x]  any type manual wheelchair      inadequate  [x]  scooter/POV inadequate      []  requires dependent mobility         POWER MOBILITY      []  Scooter/POV    []  can safely operate   []  can safely transfer   []  has adequate trunk stability   []  cannot functionally propel  manual wheelchair    [x]  Power mobility base    [x]  non-ambulatory   [x]  cannot functionally propel manual wheelchair   [x]  cannot functionally and safely      operate scooter/POV  [x]  can safely operate power  wheelchair  [x]  home is accessible  [x]  willing to use power wheelchair     Tilt  [x]  Powered tilt on powered chair  []  Powered  tilt on manual chair  []  Manual tilt on manual chair Comments:  [x]  change position for pressure      relief/cannot weight shift   []  change position against      gravitational force on head and      shoulders   [x]  decrease pain  []  blood pressure management   []  control autonomic dysreflexia  []  decrease respiratory distress  [x]  management of spasticity  []  management of low tone  [x]  facilitate postural control   [x]  rest periods   []  control edema  [x]  increase sitting tolerance   [x]  aid with transfers     Recline   [x]  Power recline on power chair  []  Manual recline on manual chair  Comments:    []  intermittent catheterization  [x]  manage spasticity  []  accommodate femur to back angle  [x]  change position for pressure relief/cannot weight shift/high risk of pressure sore development  [x]  tilt alone does not accomplish     effective pressure relief, maximum pressure relief achieved at -      _______ degrees tilt   _______ degrees recline   [x]  difficult to transfer to and from bed [x]  rest periods and sleeping in chair  [x]  repositioning for transfers  []  bring to full recline for ADL care  []  clothing/diaper changes in chair  []  gravity PEG tube feeding  []  head positioning  [x]  decrease pain  []  blood pressure management   []  control autonomic dysreflexia  []  decrease respiratory distress  []  user on ventilator     Elevator on mobility base  [x]  Power wheelchair  []  Scooter  [x]  increase Indep in transfers   [x]  increase Indep in ADLs    [x]  bathroom function and safety  [x]  kitchen/cooking function and safety  [x]  shopping  [x]  raise height for communication at standing level  [x]  raise height for eye contact which reduces cervical neck strain and pain  [x]  drive at raised height for safety and navigating crowds  []  Other:   []  Vertical position system  (anterior tilt)     (Drive locks-out)    []  Stand       (Drive enabled)  []  independent weight bearing   []  decrease joint contractures  []  decrease/manage spasticity  []  decrease/manage spasms  []  pressure distribution away from   scapula, sacrum, coccyx, and ischial tuberosity  []  increase digestion and elimination   []  access to counters and cabinets  []  increase reach  []  increase interaction with others at eye level, reduces neck strain  []  increase performance of       MRADL(s)      Power elevating legrest    []  Center mount (Single) 85-170 degrees       []  Standard (Pair) 100-170 degrees  []  position legs at 90 degrees, not available with std power ELR  []  center mount tucks into chair to decrease turning radius in home, not available with std power ELR  []  provide change in position for LE  []  elevate legs during recline    []  maintain placement of feet on      footplate  []  decrease edema  []  improve circulation  []  actuator needed to elevate legrest  []  actuator needed to articulate legrest preventing knees from flexing  []  Increase ground clearance over  curbs  []   STD (pair) independently                     elevate legrest   POWER WHEELCHAIR CONTROLS      Controls/input device  [x]  Expandable  []  Non-expandable  [x]  Proportional  [x]  Right Hand []  Left Hand  []  Non-proportional/switches/head-array  []  Electrical/proximity         []   Mechanical      Manufacturer:___________________   Type:________________________ [x]  provides access for controlling wheelchair  [x]  programming for accurate control  []  progressive disease/changing condition  []  required for alternative drive      controls       []  lacks motor control to operate  proportional drive control  []  unable to understand proportional controls  []  limited movement/strength  []  extraneous movement / tremors / ataxic / spastic       []  Upgraded electronics controller/harness    []  Single power (tilt or recline)   []  Expandable    []  Non-expandable plus   [x]  Multi-power (tilt, recline, power  legrest, power seat lift, vertical positioning system, stand)  [x]  allows input device to communicate with drive motors  [x]  harness provides necessary connections between the controller, input device, and seat functions     [x]  needed in order to operate power seat functions through joystick/ input device  []  required for alternative drive controls     []  Enhanced display  []  required to connect all alternative drive controls   []  required for upgraded joystick      (lite-throw, heavy duty, micro)  []  Allows user to see in which mode and drive the wheelchair is set; necessary for alternate controls       []  Upgraded tracking electronics  []  correct tracking when on uneven surfaces makes switch driving more efficient and less fatiguing  []  increase safety when driving  []  increase ability to traverse thresholds    []  Safety / reset / mode switches     Type:    []  Used to change modes and stop the wheelchair when driving     [x]  Mount for joystick / input device/switches  []  swing away for access or transfers   [x]  attaches joystick / input device / switches to wheelchair   [x]  provides for consistent access  []  midline for optimal placement    []  Attendant controlled joystick plus     mount  []  safety  []  long distance driving  []  operation of seat functions  []  compliance with transportation regulations    [x]  Battery  [x]  required to power (power assist / scooter/ power wc / other):   []  Power inverter (24V to 12V)  []  required for ventilator / respiratory equipment / other:     CHAIR OPTIONS MANUAL & POWER      Armrests   [x]  adjustable height []  removable  []  swing away []  fixed  [x]  flip back  []  reclining  []  full length pads [x]  desk []  tube arms []  gel pads  [x]  provide support with elbow at 90    [x]  remove/flip back/swing away for  transfers  [x]  provide support and positioning of upper body    []  allow to come closer to table top  [x]  remove for access to tables  []   provide support for w/c tray  [x]  change of height/angles for variable activities   []  Elbow support / Elbow stop  []  keep elbow positioned on arm pad  []   keep arms from falling off arm pad  during tilt and/or recline   Upper Extremity Support  []  Arm trough  []   R  []   L  Style:  []  swivel mount []  fixed mount   []  posterior hand support  []   tray  []  full tray  []  joystick cut out  []   R  []   L  Style:  []  decrease gravitational pull on      shoulders  []  provide support to increase UE  function  []  provide hand support in natural    position  []  position flaccid UE  []  decrease subluxation    []  decrease edema       []  manage spasticity   []  provide midline positioning  []  provide work surface  []  placement for AAC/ Computer/ EADL       Hangers/ Legrests   [x]  ______ degree  []  Elevating []  articulating  [x]  swing away []  fixed [x]  lift off  []  heavy duty  []  adjustable knee angle  []  adjustable calf panel   []  longer extension tube              [x]  provide LE support  [x]  maintain placement of feet on      footplate   [x]  accommodate lower leg length  []  accommodate to hamstring       tightness  [x]  enable transfers  [x]  provide change in position for LE's  []  elevate legs during recline    []  decrease edema  []  durability      Foot support   [x]  footplate [x]  R [x]  L [x]  flip up           [x]  Depth adjustable   [x]  angle adjustable  []  foot board/one piece    [x]  provide foot support  [x]  accommodate to ankle ROM  []  allow foot to go under wheelchair base  [x]  enable transfers     []  Shoe holders  []  position foot    []  decrease / manage spasticity  []  control position of LE  []  stability    []  safety     [x]  Ankle strap/heel      loops  [x]  support foot on foot support  [x]  decrease extraneous movement  []  provide input to heel   [x]  protect foot     []  Amputee adapter []  R  []  L     Style:                  Size:  []  Provide support for stump/residual  extremity    []  Transportation tie-down  []  to provide crash tested tie-down brackets    []  Crutch/cane holder    []  O2 holder    []  IV hanger   []  Ventilator tray/mount    []  stabilize accessory on wheelchair       Component  Justification     [x]  Seat cushion     True Comfort skin protection and positioning   [x]  Incontinence liner [x]  accommodate impaired sensation  []  decubitus ulcers present or history  []  unable to shift weight  [x]  increase pressure distribution  []  prevent pelvic extension  [x]  custom required "off-the-shelf"    seat cushion will not accommodate deformity  [x]  stabilize/promote pelvis alignment  []  stabilize/promote femur alignment  []  accommodate obliquity  []  accommodate multiple deformity  [x]  incontinent/accidents  [x]  low maintenance     []  seat mounts                 []   fixed []  removable  []  attach seat platform/cushion to wheelchair frame    []  Seat wedge    []  provide increased aggressiveness of seat shape to decrease sliding  down in the seat  []  accommodate ROM        []  Cover replacement   []  protect back or seat cushion  []  incontinent/accidents    []  Solid seat / insert    []  support cushion to prevent      hammocking  []  allows attachment of cushion to mobility base    []  Lateral pelvic/thigh/hip     support (Guides)     []  decrease abduction  []  accommodate pelvis  []  position upper legs  []  accommodate spasticity  []  removable for transfers     []  Lateral pelvic/thigh      supports mounts  []  fixed   []  swing-away   []  removable  []  mounts lateral pelvic/thigh supports     []  mounts lateral pelvic/thigh supports swing-away or removable for transfers    []  Medial thigh support ([place]mel)  [] decrease adduction  [] accommodate ROM  []  remove for transfers   []  alignment      []  Medial thigh   []  fixed      support mounts      []  swing-away   []  removable  []  mounts medial thigh supports   []  Mounts medial supports swing- away or  removable for transfers       Component  Justification   [x]  Back    True Comfort Positioning   [x]  provide posterior trunk support []  facilitate tone  [x]  provide lumbar/sacral support []  accommodate deformity  [x]  support trunk in midline   [x]  custom required "off-the-shelf" back support will not accommodate deformity   []  provide lateral trunk support []  accommodate or decrease tone            []  Back mounts  []  fixed  []  removable  []  attach back rest/cushion to wheelchair frame   []  Lateral trunk      supports  []  R []  L  []  decrease lateral trunk leaning  []  accommodate asymmetry    []  contour for increased contact  []  safety    []  control of tone    []  Lateral trunk      supports mounts  []  fixed  []  swing-away   []  removable  []  mounts lateral trunk supports     []  Mounts lateral trunk supports swing-away or removable for transfers   []  Anterior chest      strap, vest     []  decrease forward movement of shoulder  []  decrease forward movement of trunk  []  safety/stability  []  added abdominal support  []  trunk alignment  []  assistance with shoulder control   []  decrease shoulder elevation    [x]  Headrest      [x]  provide posterior head support  []  provide posterior neck support  []  provide lateral head support  []  provide anterior head support  [x]  support during tilt and recline  []  improve feeding     []  improve respiration  []  placement of switches  [x]  safety    [x]  accommodate ROM   []  accommodate tone  []  improve visual orientation   [x]  Headrest           []  fixed [x]  removable []  flip down      Mounting hardware   []  swing-away laterals/switches  [x]  mount headrest   [x]  mounts headrest flip down or  removable for transfers  []  mount headrest swing-away laterals   []  mount switches     []  Neck Support    []  decrease neck rotation  []  decrease forward neck flexion   Pelvic Positioner    []  std hip belt          [x]  padded hip belt  []  dual pull hip  belt  []  four point hip belt  [x]  stabilize tone  [x]  decrease falling out of chair  []  prevent excessive extension  []  special pull angle to control      rotation  [x]  pad for protection over boney   prominence  []  promote comfort    []  Essential needs        bag/pouch   []  medicines []  special food rorthotics []  clothing changes  []  diapers  []  catheter/hygiene []  ostomy supplies   The above equipment has a life- long use expectancy.  Growth and changes in medical and/or functional conditions would be the exceptions.   SUMMARY:  Why mobility device was selected; include why a lower level device is not appropriate: Patient requires use of a {manual/power:29824} wheelchair in order to perform safe and independent mobility in {his/her/their:21314} home and in order to perform {his/her/their:21314} MRADLs in a timely manner. Due to {his/her/their:21314} impaired endurance and history of *** {he/she/they:29828} is unable to safely and efficiently propel {him/her/them:29827}self in a manual wheelchair without putting significant strain on {his/her/their:21314} shoulder joints and putting {him/her/them:29827}self at risk for injury. Additionally, due to {his/her/their:21314} history of *** {he/she/they:29828} is unable to ambulate further than *** ft and has poor standing tolerance, therefore requiring power wheeled mobility.   ASSESSMENT:  CLINICAL IMPRESSION: Patient is a 58 y.o. male who was seen today for physical therapy evaluation for a new power wheelchair.   OBJECTIVE IMPAIRMENTS decreased balance, decreased coordination, decreased mobility, difficulty walking, decreased ROM, decreased strength, increased fascial restrictions, increased muscle spasms, impaired flexibility, impaired tone, impaired UE functional use, postural dysfunction, and pain.   ACTIVITY LIMITATIONS {activitylimitations:27494}  PARTICIPATION LIMITATIONS: {participationrestrictions:25113}  CLINICAL DECISION MAKING:  Stable/uncomplicated  EVALUATION COMPLEXITY: High                                   GOALS: One time visit. No goals established.    PLAN: PT FREQUENCY: one time visit    Lorita Rosa, PT Lorita Rosa, PT, DPT, CSRS   06/08/2023, 2:12 PM    I concur with the above findings and recommendations of the therapist:  Physician name printed:         Physician's signature:      Date:

## 2023-06-14 ENCOUNTER — Encounter: Payer: Self-pay | Admitting: Family Medicine

## 2023-06-14 ENCOUNTER — Ambulatory Visit: Admitting: Family Medicine

## 2023-06-14 VITALS — BP 132/80 | HR 91

## 2023-06-14 DIAGNOSIS — G809 Cerebral palsy, unspecified: Secondary | ICD-10-CM | POA: Diagnosis present

## 2023-06-14 DIAGNOSIS — R569 Unspecified convulsions: Secondary | ICD-10-CM

## 2023-06-14 DIAGNOSIS — Z993 Dependence on wheelchair: Secondary | ICD-10-CM | POA: Diagnosis not present

## 2023-06-14 NOTE — Progress Notes (Addendum)
   SUBJECTIVE:   CHIEF COMPLAINT / HPI:   DME need - h/o CP, wheelchair bound. - unable to ambulate with walker or cane due to cerebral palsy. Unable to operate manual wheelchair due to cervical spondylosis with myelopathy and does not have sufficient upper extremity strength and inability to propel himself forward. - unable to safely use scooter to meet mobility needs (unable to safely transfer and maintain postural stability due to CP) - currently in electric wheelchair, ~58 years old and started to break down. Needs new order signed. Previously saw resident but needs signed by attending physician.  - forms sent to [facility] and Mobility 05/13/23 by Dr. McDiarmid per chart review. - power wheelchair helps to maintain indepence and mobility. Allows him to stand for toileting and getting into bath - participates in PT.  Seizures - no recent seizures. Spaces out during seizures. Following with neuro. Doing well on keppra .  OBJECTIVE:   BP 132/80   Pulse 91   SpO2 97%   Gen: well appearing, in NAD. In wheelchair. Card: RRR Lungs: CTAB Ext: WWP   ASSESSMENT/PLAN:   Wheelchair dependent Forms previously completed by attending phyisican. Will look to make sure faxed previously. If needing to fill out additional forms, will do so.  Seizures (HCC) Doing well on current regimen, no changes made today. Continue to follow with neuro.     Donald CHRISTELLA Lai, DO

## 2023-06-14 NOTE — Assessment & Plan Note (Signed)
 Doing well on current regimen, no changes made today. Continue to follow with neuro.

## 2023-06-14 NOTE — Patient Instructions (Signed)
 It was great to see you!  Our plans for today:  - We will look for the forms previously filled out by one of our attending physicians. If we need to fill out additional forms, we will do so!  Let us  know if there's anything else we can do for you!  Take care and seek immediate care sooner if you develop any concerns.   Dr. Irma Roulhac

## 2023-06-14 NOTE — Assessment & Plan Note (Signed)
 Forms previously completed by attending phyisican. Will look to make sure faxed previously. If needing to fill out additional forms, will do so.

## 2023-06-30 ENCOUNTER — Other Ambulatory Visit: Payer: Self-pay

## 2023-07-01 MED ORDER — LOPERAMIDE HCL 2 MG PO CAPS: 2.0000 mg | ORAL_CAPSULE | Freq: Every day | ORAL | 2 refills | Status: DC

## 2023-07-27 ENCOUNTER — Encounter: Payer: Self-pay | Admitting: *Deleted

## 2023-10-04 ENCOUNTER — Ambulatory Visit: Payer: Medicare Other | Admitting: Family Medicine

## 2023-10-04 NOTE — Progress Notes (Signed)
 Patient not seen.

## 2023-12-02 ENCOUNTER — Other Ambulatory Visit: Payer: Self-pay

## 2023-12-02 DIAGNOSIS — J4520 Mild intermittent asthma, uncomplicated: Secondary | ICD-10-CM

## 2023-12-02 MED ORDER — ARNUITY ELLIPTA 100 MCG/ACT IN AEPB: 1.0000 | INHALATION_SPRAY | Freq: Every day | RESPIRATORY_TRACT | 11 refills | Status: DC | PRN

## 2023-12-02 NOTE — Telephone Encounter (Signed)
 [facility] calls nurse line requesting a new prescription for Arnuity.  She reports previous prescriber is not [place])  Will forward to new PCP to send in.

## 2023-12-09 ENCOUNTER — Ambulatory Visit: Admitting: Student

## 2023-12-17 ENCOUNTER — Encounter: Payer: Self-pay | Admitting: Student

## 2023-12-17 ENCOUNTER — Ambulatory Visit: Payer: Self-pay | Admitting: Student

## 2023-12-17 ENCOUNTER — Ambulatory Visit: Admitting: Student

## 2023-12-17 VITALS — BP 110/82 | HR 82

## 2023-12-17 DIAGNOSIS — Z13220 Encounter for screening for lipoid disorders: Secondary | ICD-10-CM

## 2023-12-17 DIAGNOSIS — Z1211 Encounter for screening for malignant neoplasm of colon: Secondary | ICD-10-CM

## 2023-12-17 DIAGNOSIS — J4520 Mild intermittent asthma, uncomplicated: Secondary | ICD-10-CM

## 2023-12-17 DIAGNOSIS — Z0000 Encounter for general adult medical examination without abnormal findings: Secondary | ICD-10-CM | POA: Diagnosis present

## 2023-12-17 DIAGNOSIS — R569 Unspecified convulsions: Secondary | ICD-10-CM | POA: Diagnosis not present

## 2023-12-17 DIAGNOSIS — Z23 Encounter for immunization: Secondary | ICD-10-CM | POA: Diagnosis not present

## 2023-12-17 DIAGNOSIS — Z13228 Encounter for screening for other metabolic disorders: Secondary | ICD-10-CM

## 2023-12-17 DIAGNOSIS — Z131 Encounter for screening for diabetes mellitus: Secondary | ICD-10-CM | POA: Diagnosis not present

## 2023-12-17 LAB — POCT GLYCOSYLATED HEMOGLOBIN (HGB A1C): Hemoglobin A1C: 4.4 % (ref 4.0–5.6)

## 2023-12-17 MED ORDER — LOPERAMIDE HCL 2 MG PO CAPS: 2.0000 mg | ORAL_CAPSULE | Freq: Every day | ORAL | 2 refills | Status: AC

## 2023-12-17 MED ORDER — ALBUTEROL SULFATE (2.5 MG/3ML) 0.083% IN NEBU: 2.5000 mg | INHALATION_SOLUTION | Freq: Four times a day (QID) | RESPIRATORY_TRACT | 0 refills | Status: DC | PRN

## 2023-12-17 MED ORDER — ARNUITY ELLIPTA 100 MCG/ACT IN AEPB: 1.0000 | INHALATION_SPRAY | Freq: Every day | RESPIRATORY_TRACT | 11 refills | Status: AC | PRN

## 2023-12-17 MED ORDER — LEVETIRACETAM 500 MG PO TABS: 500.0000 mg | ORAL_TABLET | Freq: Two times a day (BID) | ORAL | 3 refills | Status: AC

## 2023-12-17 NOTE — Assessment & Plan Note (Signed)
 Stable Continue inhalers

## 2023-12-17 NOTE — Patient Instructions (Signed)
 It was great to see you! Thank you for allowing me to participate in your care!   I recommend that you always bring your medications to each appointment as this makes it easy to ensure we are on the correct medications and helps us  not miss when refills are needed.  Our plans for today:  - I have refilled your medications - You will receive a call for medicare annual wellness  - Please complete your cologuard and return in the mail - We are checking some labs today, I will call you if they are abnormal will send you a MyChart message or a letter if they are normal.  If you do not hear about your labs in the next 2 weeks please let us  know.  Take care and seek immediate care sooner if you develop any concerns. Please remember to show up 15 minutes before your scheduled appointment time!  Gladis Church, DO [facility]

## 2023-12-17 NOTE — Progress Notes (Signed)
     SUBJECTIVE:   Chief compliant/HPI: annual examination  [name] is a 58 y.o. who presents today for an annual exam.   History tabs reviewed and updated.   No acute concerns today. Requesting med refills.  OBJECTIVE:   BP 110/82   Pulse 82   SpO2 97%    General: NAD, pleasant, wheel chair bound Cardio: RRR, no MRG. Respiratory: CTAB, normal wob on RA Skin: Warm and dry  ASSESSMENT/PLAN:   Assessment & Plan Annual physical exam PHQ score 0, reviewed and discussed.  Blood pressure value is at goal, discussed.   Considered the following screening exams based upon USPSTF recommendations: Diabetes screening: ordered HIV testing:Not high risk Hepatitis C: previously done Hepatitis B:Not disucssed Syphilis if at high risk: not high risk GC/CT not at high risk and not ordered. Lipid panel (nonfasting or fasting) discussed based upon AHA recommendations and ordered.  Consider repeat every 4-6 years.   Cancer Screening Discussion  Lung cancer screening:not indicated as does not meet criteria.  See documentation below regarding indications/risks/benefits.  Colorectal cancer screening: discussed options, elected for cologuard.  Vaccinations: UTD on Flu. Getting Prevnar today.   Due for medicare AWV, will schedule. Patient agreeable.  Follow up in 1 year or sooner if indicated.  MyChart Activation: Already signed up Mild intermittent chronic asthma without complication - Stable - Continue inhalers Seizures (HCC) -Stable -Refill Keppra   Gladis Church, DO [facility]

## 2023-12-17 NOTE — Assessment & Plan Note (Signed)
-  Stable -Refill Keppra

## 2023-12-18 LAB — BASIC METABOLIC PANEL WITH GFR
BUN/Creatinine Ratio: 25 — ABNORMAL HIGH (ref 9–20)
BUN: 17 mg/dL (ref 6–24)
CO2: 22 mmol/L (ref 20–29)
Calcium: 10.2 mg/dL (ref 8.7–10.2)
Chloride: 98 mmol/L (ref 96–106)
Creatinine, Ser: 0.67 mg/dL — ABNORMAL LOW (ref 0.76–1.27)
Glucose: 82 mg/dL (ref 70–99)
Potassium: 4.6 mmol/L (ref 3.5–5.2)
Sodium: 140 mmol/L (ref 134–144)
eGFR: 110 mL/min/1.73 (ref >59)

## 2023-12-18 LAB — LIPID PANEL
Chol/HDL Ratio: 3.4 ratio (ref 0.0–5.0)
Cholesterol, Total: 227 mg/dL — ABNORMAL HIGH (ref 100–199)
HDL: 67 mg/dL (ref >39)
LDL Chol Calc (NIH): 139 mg/dL — ABNORMAL HIGH (ref 0–99)
Triglycerides: 121 mg/dL (ref 0–149)
VLDL Cholesterol Cal: 21 mg/dL (ref 5–40)

## 2024-01-04 ENCOUNTER — Other Ambulatory Visit: Payer: Self-pay

## 2024-01-04 ENCOUNTER — Other Ambulatory Visit: Payer: Self-pay | Admitting: Student

## 2024-01-04 MED ORDER — ALBUTEROL SULFATE (2.5 MG/3ML) 0.083% IN NEBU: 2.5000 mg | INHALATION_SOLUTION | Freq: Four times a day (QID) | RESPIRATORY_TRACT | 0 refills | Status: DC | PRN

## 2024-01-07 ENCOUNTER — Other Ambulatory Visit: Payer: Self-pay

## 2024-01-07 DIAGNOSIS — J4520 Mild intermittent asthma, uncomplicated: Secondary | ICD-10-CM

## 2024-01-07 LAB — COLOGUARD: COLOGUARD: NEGATIVE

## 2024-01-07 NOTE — Telephone Encounter (Signed)
 Patient's mother calls nurse line regarding patient's daily inhaler. She states that she needs refill on Flovent  inhaler. Advised that patient takes Arnuity Inhaler daily.   Mother is asking that daily inhaler be sent to local [facility]. Called [facility]. They transferred prescription to [facility].   Confirmation number: [id number]  Called mother and provided with update.   She was appreciative.   Chiquita JAYSON English, RN

## 2024-01-21 NOTE — Telephone Encounter (Signed)
 Patients mother calls nurse line in regards to Arnuity Inhaler.   She reports she has not been able to pick this up from [facility].   I called [facility] to make sure prescription was effectively transferred.   They report they have the prescription, however they need to order it. They report this should be ready for pick up on Monday.   Called mother and gave update.   She was appreciative.

## 2024-01-26 ENCOUNTER — Telehealth: Payer: Self-pay

## 2024-01-26 NOTE — Telephone Encounter (Signed)
 Mother calls nurse line regarding issues with picking up albuterol  solution.   Called [facility]. They had medication stored on profile. They asked for diagnosis code per Medicare guidelines. Dx code provided.   They will have medication ready within a few hours.   Mother called and provided with update.   Chiquita JAYSON English, RN

## 2024-03-01 ENCOUNTER — Telehealth: Payer: Self-pay

## 2024-03-01 NOTE — Telephone Encounter (Signed)
 Patient's mother calls nurse line requesting renewal on handicap placard.   Printed off DMV handicap application and placed in PCP box for completion.   Chiquita JAYSON English, RN

## 2024-03-02 NOTE — Telephone Encounter (Signed)
 Form completed, placed in RN triage.

## 2024-03-03 NOTE — Telephone Encounter (Signed)
Patient's mother called and informed that forms are ready for pick up. Copy made and placed in batch scanning. Original placed at front desk for pick up.  ° °Dehlia Kilner C Tara Wich, RN ° ° °

## 2024-03-27 ENCOUNTER — Encounter

## 2024-04-17 ENCOUNTER — Telehealth: Admitting: Family Medicine

## 2024-04-20 ENCOUNTER — Telehealth: Admitting: Family Medicine

## 2024-05-03 ENCOUNTER — Encounter: Admitting: Family Medicine
